# Patient Record
Sex: Male | Born: 1980
Health system: Southern US, Community
[De-identification: ages and names within clinical notes are randomized; demographics above are authoritative.]

## PROBLEM LIST (undated history)

## (undated) DIAGNOSIS — G35 Multiple sclerosis: Secondary | ICD-10-CM

## (undated) DIAGNOSIS — J9819 Other pulmonary collapse: Secondary | ICD-10-CM

## (undated) DIAGNOSIS — Z8619 Personal history of other infectious and parasitic diseases: Secondary | ICD-10-CM

## (undated) DIAGNOSIS — G35D Multiple sclerosis, unspecified: Secondary | ICD-10-CM

## (undated) HISTORY — DX: Other pulmonary collapse: J98.19

## (undated) HISTORY — DX: Personal history of other infectious and parasitic diseases: Z86.19

## (undated) HISTORY — DX: Multiple sclerosis, unspecified: G35.D

## (undated) HISTORY — DX: Multiple sclerosis: G35

---

## 2001-05-19 ENCOUNTER — Encounter: Payer: Self-pay | Admitting: Thoracic Surgery (Cardiothoracic Vascular Surgery)

## 2001-05-19 ENCOUNTER — Inpatient Hospital Stay (HOSPITAL_COMMUNITY): Admission: EM | Admit: 2001-05-19 | Discharge: 2001-05-21 | Payer: Self-pay | Admitting: Emergency Medicine

## 2001-05-20 ENCOUNTER — Encounter: Payer: Self-pay | Admitting: Thoracic Surgery (Cardiothoracic Vascular Surgery)

## 2001-05-21 ENCOUNTER — Encounter: Payer: Self-pay | Admitting: Thoracic Surgery (Cardiothoracic Vascular Surgery)

## 2001-05-28 ENCOUNTER — Encounter: Admission: RE | Admit: 2001-05-28 | Discharge: 2001-05-28 | Payer: Self-pay | Admitting: Unknown Physician Specialty

## 2001-05-28 ENCOUNTER — Encounter: Payer: Self-pay | Admitting: Thoracic Surgery (Cardiothoracic Vascular Surgery)

## 2002-04-19 ENCOUNTER — Encounter: Payer: Self-pay | Admitting: Surgery

## 2002-04-19 ENCOUNTER — Inpatient Hospital Stay (HOSPITAL_COMMUNITY): Admission: EM | Admit: 2002-04-19 | Discharge: 2002-04-24 | Payer: Self-pay | Admitting: Emergency Medicine

## 2002-04-21 ENCOUNTER — Encounter: Payer: Self-pay | Admitting: Surgery

## 2002-04-22 ENCOUNTER — Encounter: Payer: Self-pay | Admitting: Surgery

## 2002-04-23 ENCOUNTER — Encounter: Payer: Self-pay | Admitting: Surgery

## 2002-04-24 ENCOUNTER — Encounter: Payer: Self-pay | Admitting: Surgery

## 2002-04-28 ENCOUNTER — Encounter: Payer: Self-pay | Admitting: Surgery

## 2002-04-28 ENCOUNTER — Encounter: Admission: RE | Admit: 2002-04-28 | Discharge: 2002-04-28 | Payer: Self-pay | Admitting: Surgery

## 2003-03-23 ENCOUNTER — Inpatient Hospital Stay (HOSPITAL_COMMUNITY): Admission: EM | Admit: 2003-03-23 | Discharge: 2003-03-26 | Payer: Self-pay | Admitting: Emergency Medicine

## 2003-04-02 ENCOUNTER — Encounter: Admission: RE | Admit: 2003-04-02 | Discharge: 2003-04-02 | Payer: Self-pay | Admitting: Cardiothoracic Surgery

## 2003-04-15 ENCOUNTER — Inpatient Hospital Stay (HOSPITAL_COMMUNITY): Admission: EM | Admit: 2003-04-15 | Discharge: 2003-04-22 | Payer: Self-pay | Admitting: Emergency Medicine

## 2003-04-26 ENCOUNTER — Inpatient Hospital Stay (HOSPITAL_COMMUNITY): Admission: EM | Admit: 2003-04-26 | Discharge: 2003-05-02 | Payer: Self-pay | Admitting: *Deleted

## 2003-05-07 ENCOUNTER — Encounter: Admission: RE | Admit: 2003-05-07 | Discharge: 2003-05-07 | Payer: Self-pay | Admitting: Cardiothoracic Surgery

## 2003-05-26 ENCOUNTER — Encounter: Admission: RE | Admit: 2003-05-26 | Discharge: 2003-05-26 | Payer: Self-pay | Admitting: Thoracic Surgery

## 2003-07-23 ENCOUNTER — Emergency Department (HOSPITAL_COMMUNITY): Admission: EM | Admit: 2003-07-23 | Discharge: 2003-07-24 | Payer: Self-pay | Admitting: Family Medicine

## 2003-10-25 ENCOUNTER — Emergency Department (HOSPITAL_COMMUNITY): Admission: EM | Admit: 2003-10-25 | Discharge: 2003-10-25 | Payer: Self-pay | Admitting: Family Medicine

## 2003-11-24 ENCOUNTER — Emergency Department (HOSPITAL_COMMUNITY): Admission: EM | Admit: 2003-11-24 | Discharge: 2003-11-24 | Payer: Self-pay | Admitting: Emergency Medicine

## 2003-11-24 ENCOUNTER — Encounter: Admission: RE | Admit: 2003-11-24 | Discharge: 2003-11-24 | Payer: Self-pay | Admitting: Cardiothoracic Surgery

## 2003-12-03 ENCOUNTER — Encounter: Admission: RE | Admit: 2003-12-03 | Discharge: 2003-12-03 | Payer: Self-pay | Admitting: Cardiothoracic Surgery

## 2003-12-09 ENCOUNTER — Inpatient Hospital Stay (HOSPITAL_COMMUNITY): Admission: RE | Admit: 2003-12-09 | Discharge: 2003-12-13 | Payer: Self-pay | Admitting: Cardiothoracic Surgery

## 2004-01-07 ENCOUNTER — Encounter: Admission: RE | Admit: 2004-01-07 | Discharge: 2004-01-07 | Payer: Self-pay | Admitting: Cardiothoracic Surgery

## 2004-03-05 HISTORY — PX: OTHER SURGICAL HISTORY: SHX169

## 2004-04-28 IMAGING — CR DG CHEST 2V
2 series · 2 of 2 positions shown · non-contrast
Comparison: none

CLINICAL DATA: Short of breath. 
 CHEST, TWO VIEWS  
 PA and lateral views of the chest dated April 26, 2003 is compared with a prior study April 22, 2003.  Since the previous study, there is evidence of a right pneumothorax in the apical area estimated to be 15 to 20 percent.  This represents a decided interval change since the previous study.  No change is noted on the left.  There are numerous sutures noted in the apical area. 
 IMPRESSION
 Right apical pneumothorax now estimated to be 15 to 20 percent.

[view not recorded (1 of 2)]
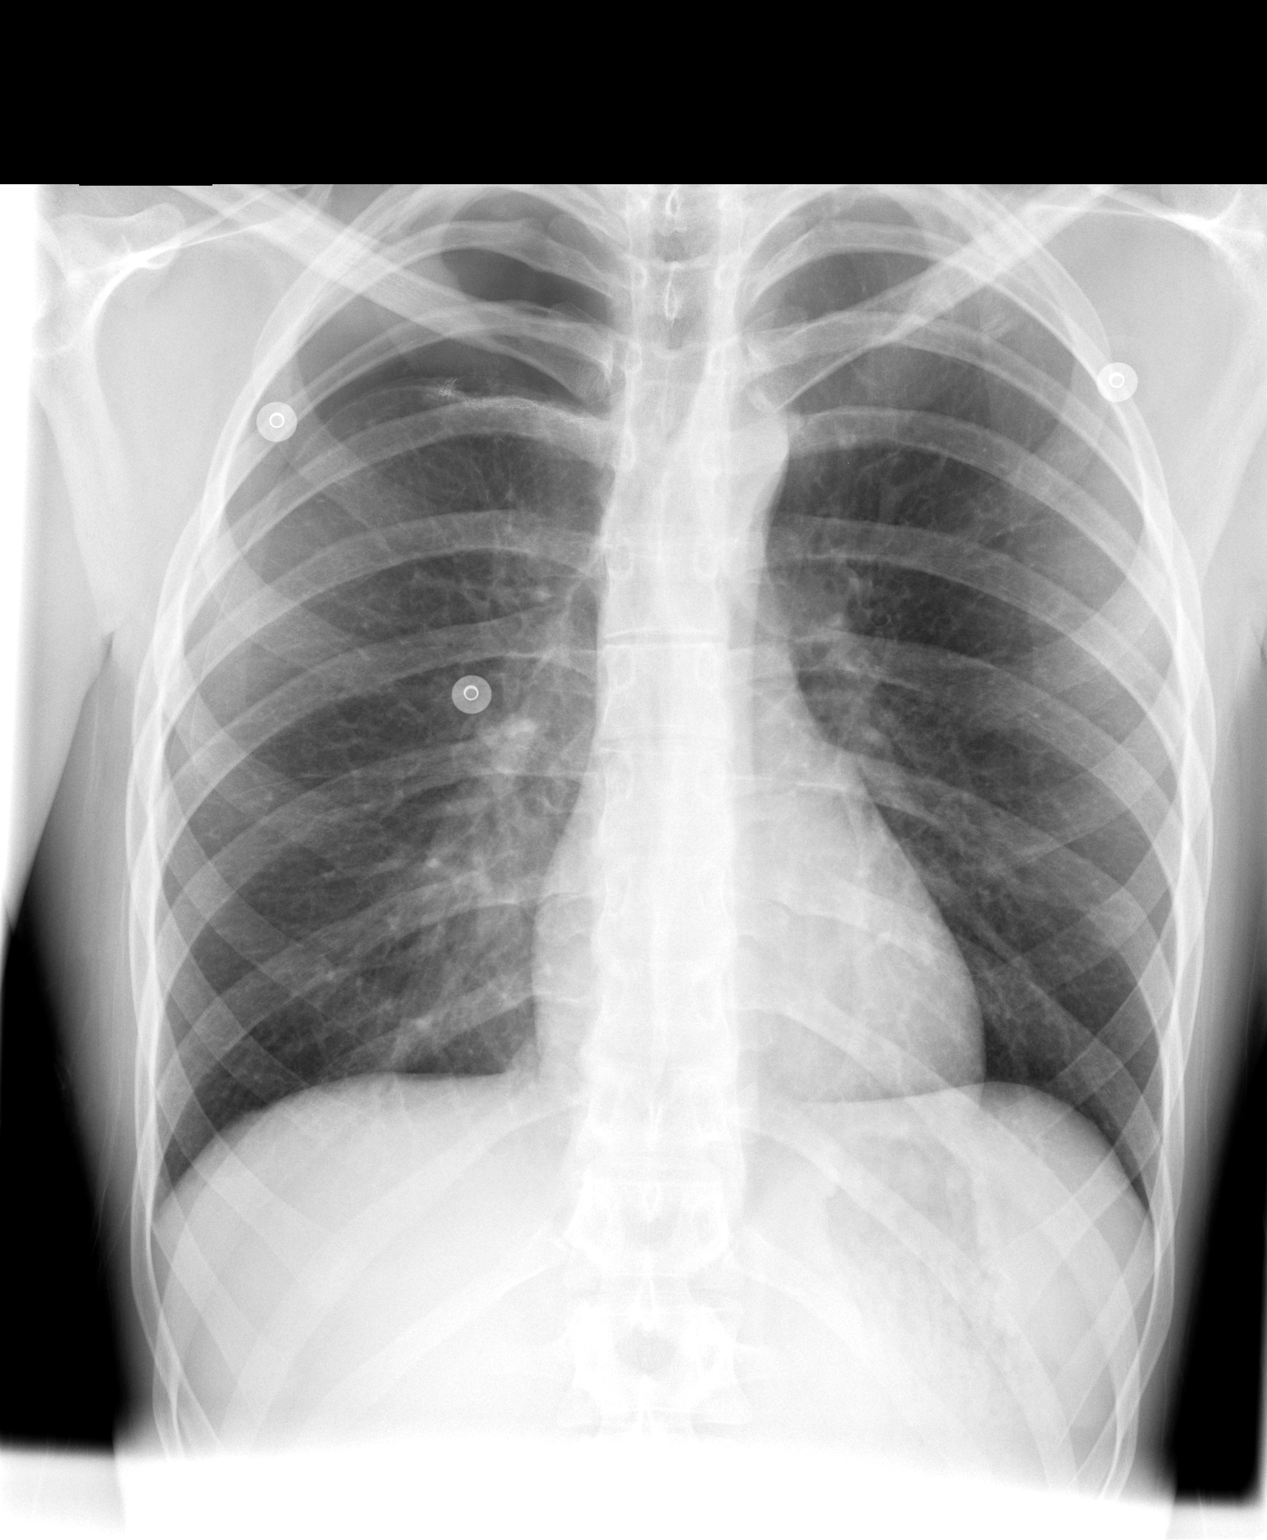

[view not recorded (2 of 2)]
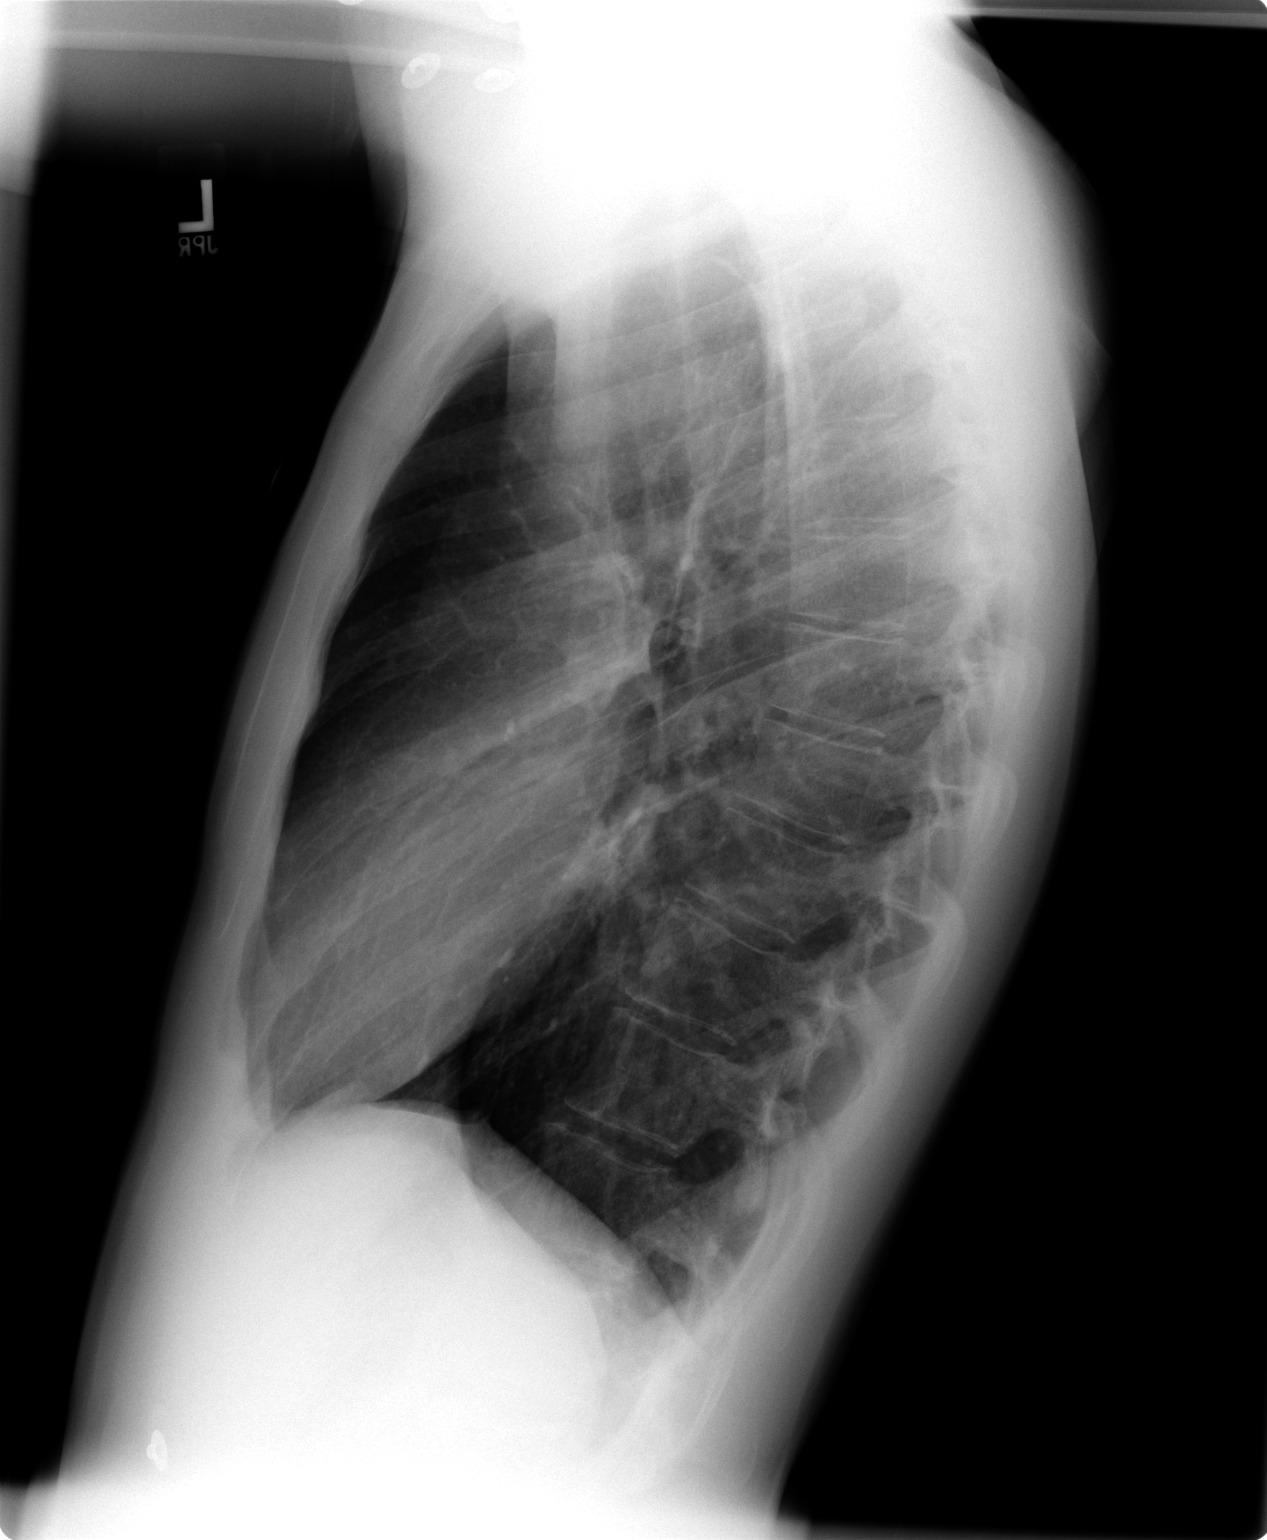

[2 of 2 positions shown; findings below may reference images not displayed]

## 2004-05-02 IMAGING — CR DG CHEST 1V PORT
1 series · 1 of 1 positions shown · non-contrast
Comparison: 04/29/03.

CLINICAL DATA: Lung lesion, pneumothorax.
 CHEST, PORTABLE ONE VIEW ? 04/30/03, 7097

[view not recorded]
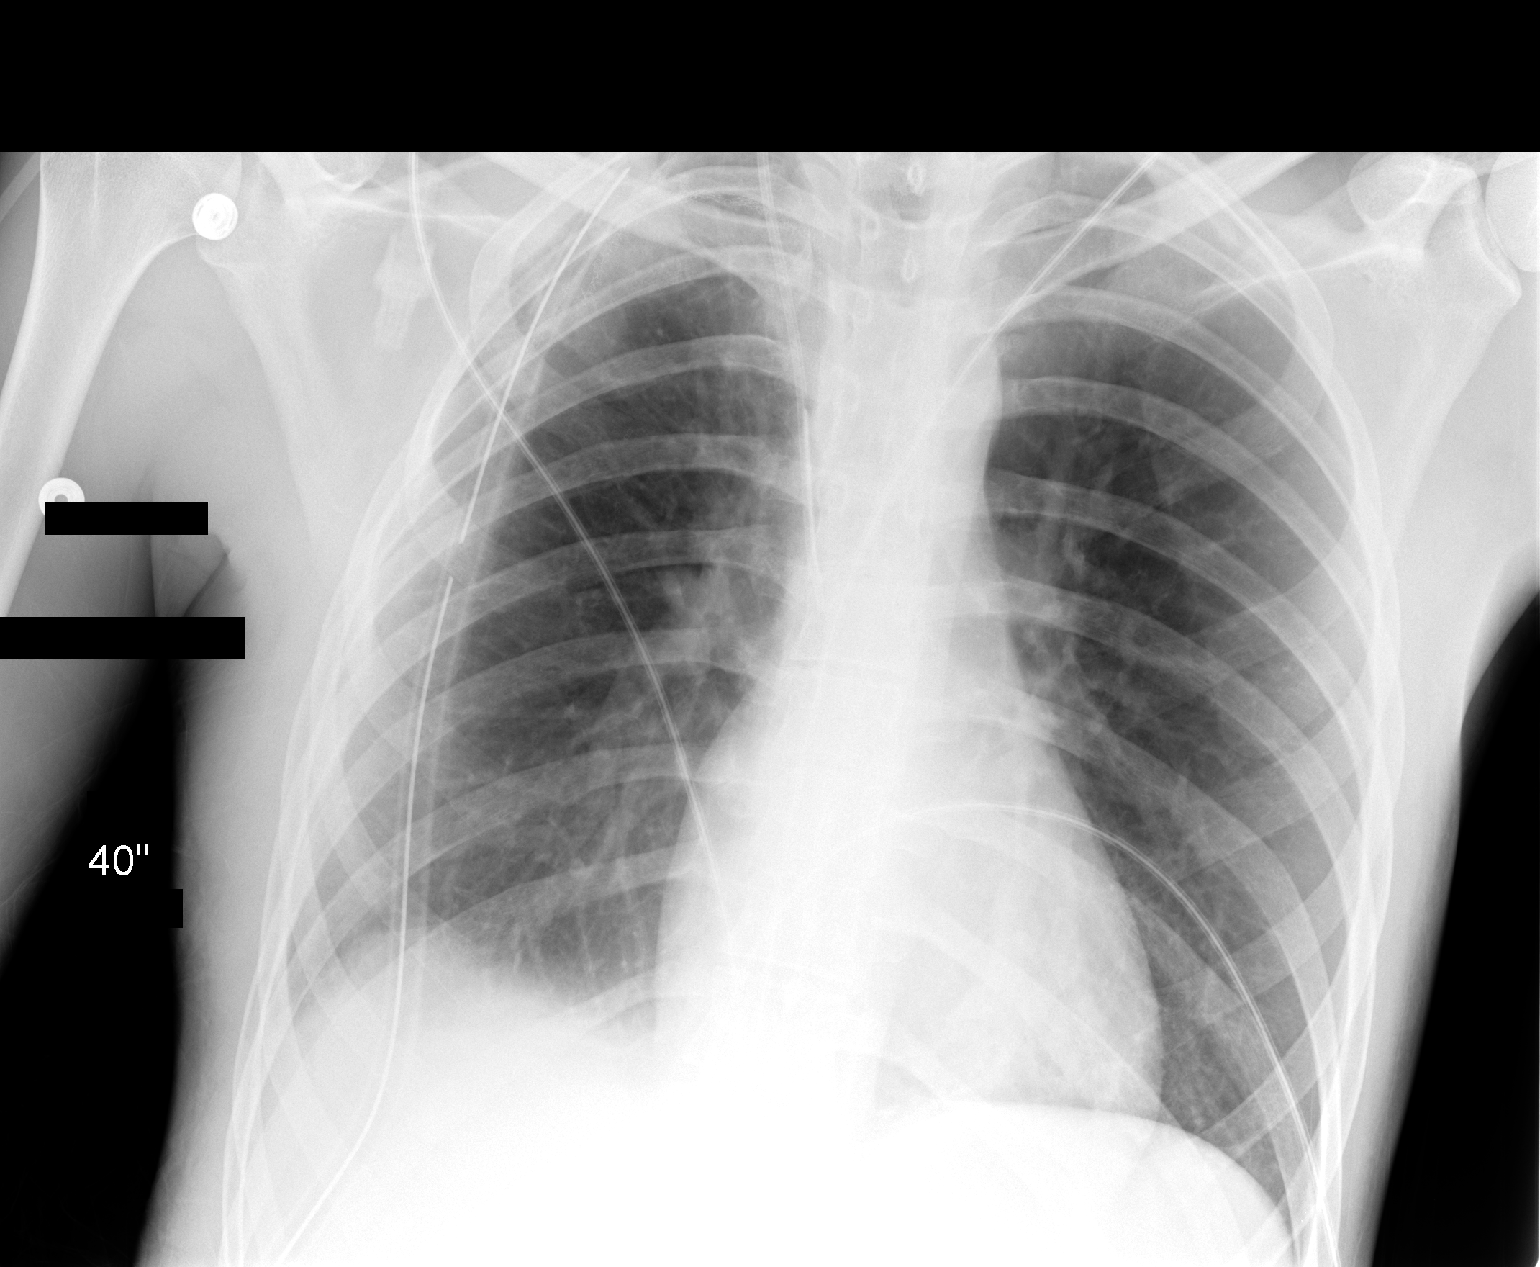

[1 of 1 positions shown; findings below may reference images not displayed]

There is slight interval increase in right lower lobe airspace opacity, atelectasis versus infiltrate.  Left lung clear.  Heart is normal in size. 
 Right chest tube remains in place.  Small right apical pneumothorax noted, not significantly changed since the prior study.
IMPRESSION: Stable small right apical pneumothorax.
 Slight increase in right basilar opacity.

## 2005-01-09 IMAGING — CR DG CHEST 2V
2 series · 2 of 2 positions shown · non-contrast
Comparison: none

CLINICAL DATA: Followup left apical pneumothorax. 
 DIAGNOSTIC CHEST - TWO VIEW:

[view not recorded (1 of 2)]
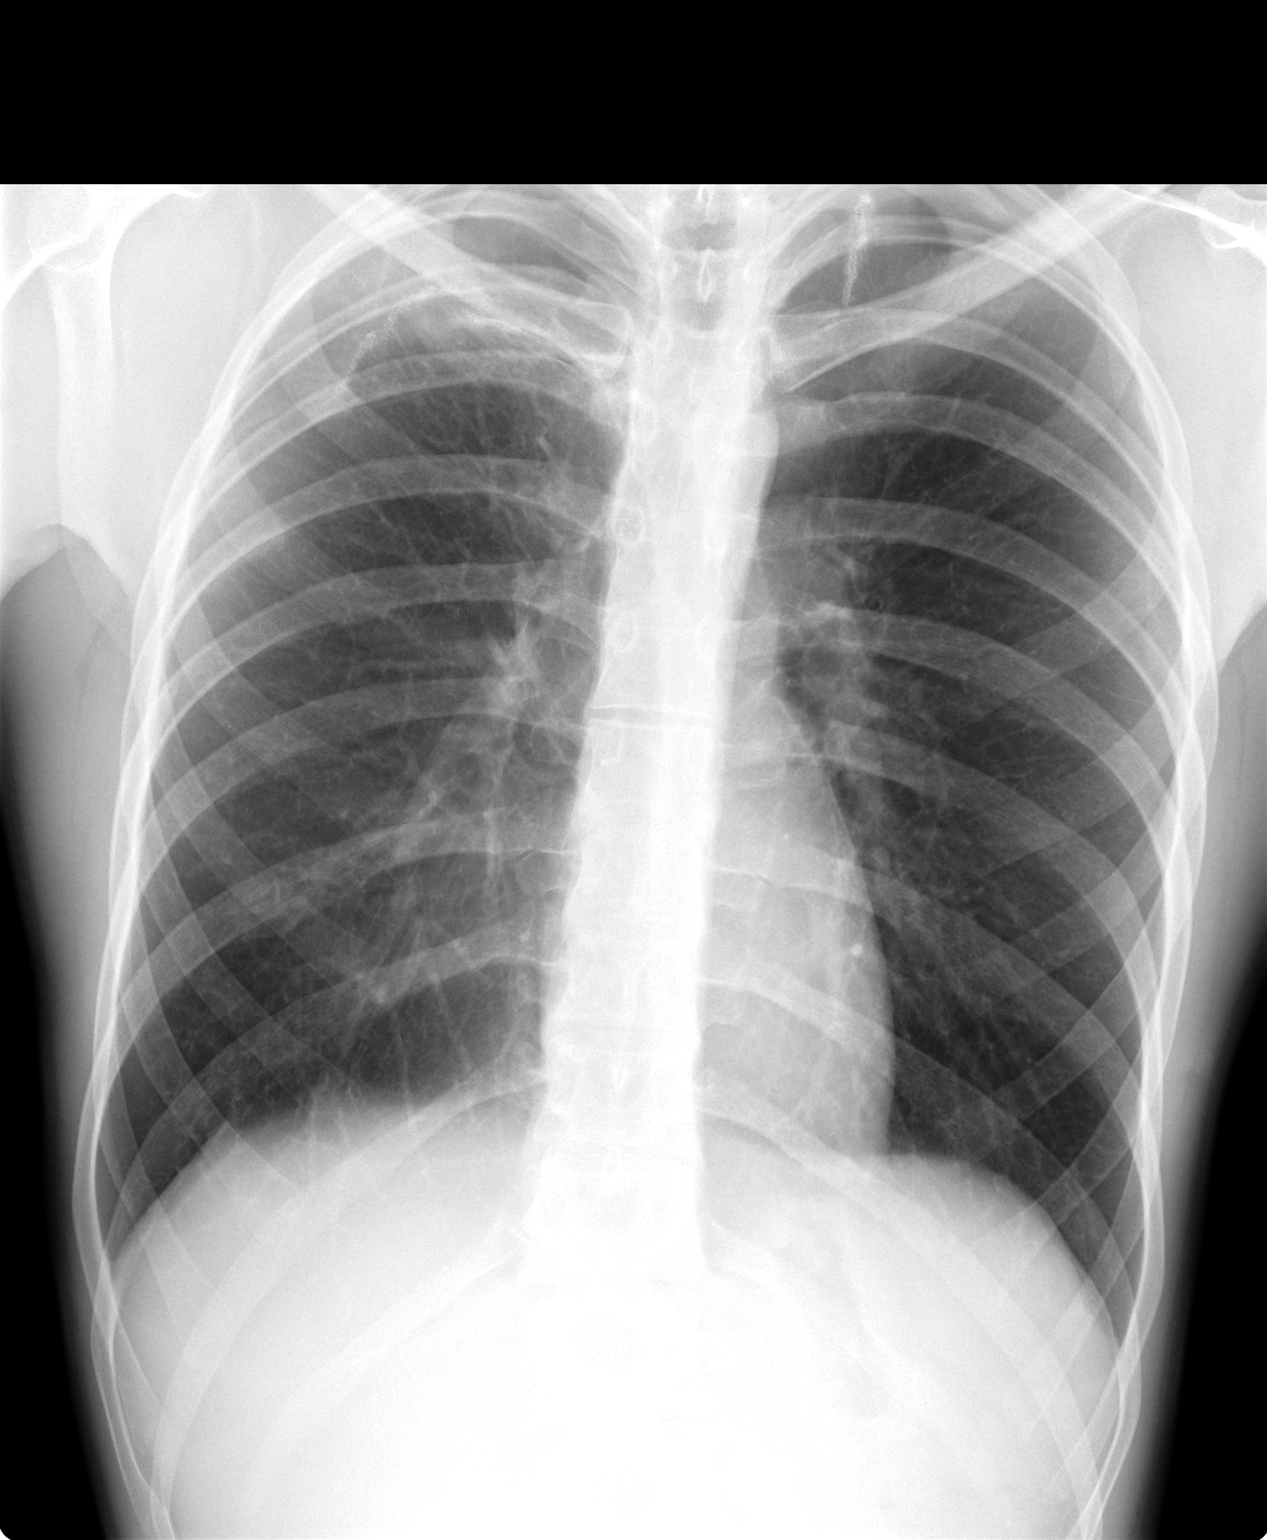

[view not recorded (2 of 2)]
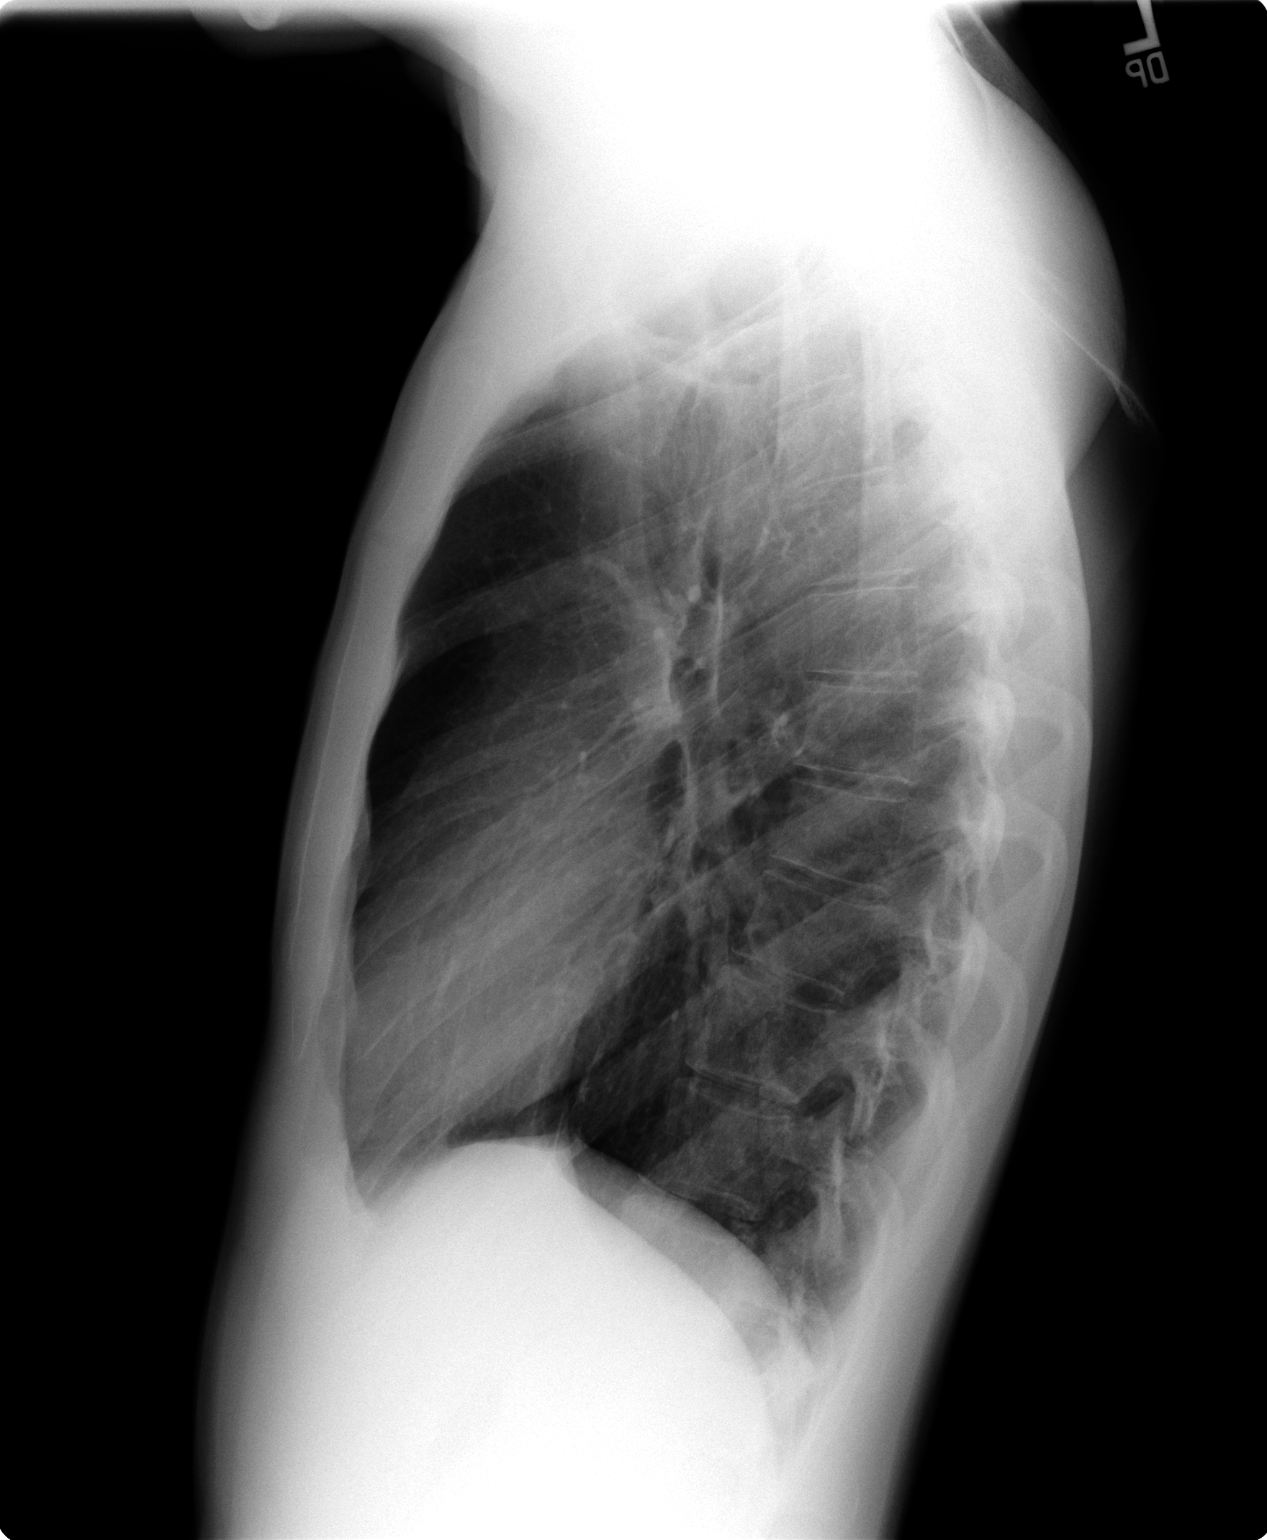

[2 of 2 positions shown; findings below may reference images not displayed]

FINDINGS: Since [REDACTED] chest x-ray 12/13/03 there is resolution of left apical pneumothorax with new left apical sutures seen.  Suture line at the right apex and slight linear fibrosis in this region are stable.  Lungs are clear.  Heart size is normal.  No new abnormality is seen.
IMPRESSION: Since [REDACTED] chest x-ray 12/13/03:
 1.  Post-operative change with resolution previous left apical pneumothorax.
 2.  Stable postoperative change and mild fibrosis right apex.
 3.  Otherwise normal.

## 2010-03-26 ENCOUNTER — Encounter: Payer: Self-pay | Admitting: Thoracic Surgery

## 2010-07-10 ENCOUNTER — Ambulatory Visit (INDEPENDENT_AMBULATORY_CARE_PROVIDER_SITE_OTHER): Payer: 59 | Admitting: Family Medicine

## 2010-07-10 ENCOUNTER — Ambulatory Visit: Payer: Self-pay | Admitting: Family Medicine

## 2010-07-10 ENCOUNTER — Encounter: Payer: Self-pay | Admitting: Family Medicine

## 2010-07-10 DIAGNOSIS — M549 Dorsalgia, unspecified: Secondary | ICD-10-CM | POA: Insufficient documentation

## 2010-07-10 DIAGNOSIS — Z0000 Encounter for general adult medical examination without abnormal findings: Secondary | ICD-10-CM | POA: Insufficient documentation

## 2010-07-10 DIAGNOSIS — Z720 Tobacco use: Secondary | ICD-10-CM | POA: Insufficient documentation

## 2010-07-10 DIAGNOSIS — Z13220 Encounter for screening for lipoid disorders: Secondary | ICD-10-CM | POA: Insufficient documentation

## 2010-07-10 DIAGNOSIS — Z23 Encounter for immunization: Secondary | ICD-10-CM

## 2010-07-10 DIAGNOSIS — Z131 Encounter for screening for diabetes mellitus: Secondary | ICD-10-CM | POA: Insufficient documentation

## 2010-07-10 DIAGNOSIS — F17200 Nicotine dependence, unspecified, uncomplicated: Secondary | ICD-10-CM

## 2010-07-10 LAB — GLUCOSE, RANDOM: Glucose, Bld: 79 mg/dL (ref 70–99)

## 2010-07-10 LAB — LIPID PANEL
HDL: 62.2 mg/dL (ref >39.00)
Total CHOL/HDL Ratio: 2
VLDL: 9.4 mg/dL (ref 0.0–40.0)

## 2010-07-10 MED ORDER — TETANUS-DIPHTH-ACELL PERTUSSIS 5-2.5-18.5 LF-MCG/0.5 IM SUSP: 0.5000 mL | Freq: Once | INTRAMUSCULAR | Status: DC

## 2010-07-10 NOTE — Progress Notes (Signed)
New patient.  CPE- See plan.  Routine anticipatory guidance given to patient.  See health maintenance.  Wants to quit smoking.  2-4 cigs a day.  D/w pt.  Counseled on use of nicotine gum.  He has a quit date.   L lower back pain.  No radiation.  Present several weeks.  No trauma.  No weakness.  No FCNAV.    PMH and SH reviewed  Meds, vitals, and allergies reviewed.   ROS: See HPI.  Otherwise negative.    GEN: nad, alert and oriented HEENT: mucous membranes moist NECK: supple w/o LA CV: rrr. PULM: ctab, no inc wob, old scars noted on chest. ABD: soft, +bs EXT: no edema SKIN: no acute rash BACK: no midline pain.  L paraspinal muscles ttp near the waistline.  SLR neg but hamstrings tight.  Distally nv intact.

## 2010-07-10 NOTE — Assessment & Plan Note (Signed)
Counseled, he has a quit date.  Use the gum and call back as needed.

## 2010-07-10 NOTE — Assessment & Plan Note (Signed)
D/w pt about diet and exercise.  Check HIV, never prev done.  tdap today.  Other screening now up to date.

## 2010-07-10 NOTE — Progress Notes (Signed)
Addended by: Delilah Shan on: 07/10/2010 11:02 AM   Modules accepted: Orders

## 2010-07-10 NOTE — Assessment & Plan Note (Signed)
Check labs 

## 2010-07-10 NOTE — Patient Instructions (Addendum)
I would stretch your back and hamstrings as we discussed.  Stretch slowly and hold it for about 30 seconds.  A heating pad may help you back.  I would check your chair and mouse position at work. I would get flavored nicotine gum and use this as needed as we discussed.  Let me know if you have other concerns.  I would get a flu shot each fall.   Take care.  Glad to see you today.  You can get your results through our phone system.  Follow the instructions on the blue card.

## 2010-07-10 NOTE — Assessment & Plan Note (Signed)
Benign exam, stretch and heat.  He'll check his workstation to see if this increases the strain on lower back.  Fu prn.

## 2010-10-18 ENCOUNTER — Encounter: Payer: Self-pay | Admitting: Family Medicine

## 2010-10-18 ENCOUNTER — Ambulatory Visit (INDEPENDENT_AMBULATORY_CARE_PROVIDER_SITE_OTHER): Payer: 59 | Admitting: Family Medicine

## 2010-10-18 VITALS — BP 104/70 | HR 64 | Temp 97.7°F | Wt 135.8 lb

## 2010-10-18 DIAGNOSIS — S239XXA Sprain of unspecified parts of thorax, initial encounter: Secondary | ICD-10-CM

## 2010-10-18 DIAGNOSIS — S29012A Strain of muscle and tendon of back wall of thorax, initial encounter: Secondary | ICD-10-CM | POA: Insufficient documentation

## 2010-10-18 MED ORDER — CYCLOBENZAPRINE HCL 5 MG PO TABS: 5.0000 mg | ORAL_TABLET | Freq: Two times a day (BID) | ORAL | Status: AC | PRN

## 2010-10-18 NOTE — Patient Instructions (Signed)
Muscle strain in back. Start flexeril as muscle relaxant - may make you sleepy. Continue ibuprofen. Stretching exercises for back provided as well. If not improving, let us know for referral to physical therapy.   Call us if any questions.

## 2010-10-18 NOTE — Progress Notes (Signed)
  Subjective:    Patient ID: Juan Foster, male    DOB: [birth year], 30 y.o.   MRN: [id number]  HPI CC: check place on back  Started noticing stiffness 7d ago right shoulder, then 5d ago started feeling stabbing pain inside shoulder blade.  Had played golf, and thought may have pulled muscle.  Worsening pain, feels one specific spot where has sharp pain when pushes on this area.  Wanted checked out.  Did try ice when this started.  No SOB, cough, pleurisy, no fevers/chills, no changes on skin.  Has cut out cigarettes, rarely smokes pipe  never had anything like this before  Review of Systems Per HPI    Objective:   Physical Exam  Nursing note and vitals reviewed. Constitutional: He appears well-developed and well-nourished. No distress.  Musculoskeletal: He exhibits no edema.       Mild midline thoracic tenderness. Main tenderness located right rhomboid major with spasm/tightness noted. Scapular dyskinesia on right compared to left. Reproducible tenderness to palpation. O/w FROM at shoulders  Skin: Skin is warm and dry. No rash noted. No erythema.  Psychiatric: He has a normal mood and affect.          Assessment & Plan:

## 2010-10-18 NOTE — Assessment & Plan Note (Signed)
Anticipate rhomboid strain. Supportive care, continue NSAIDs and start flexeril. SM pt handout on upper back pain provided. If not better discussed possible referral to PT.

## 2011-04-17 ENCOUNTER — Ambulatory Visit (INDEPENDENT_AMBULATORY_CARE_PROVIDER_SITE_OTHER): Payer: 59 | Admitting: Family Medicine

## 2011-04-17 ENCOUNTER — Encounter: Payer: Self-pay | Admitting: Family Medicine

## 2011-04-17 VITALS — BP 122/84 | HR 54 | Temp 98.3°F | Wt 138.0 lb

## 2011-04-17 DIAGNOSIS — M25519 Pain in unspecified shoulder: Secondary | ICD-10-CM

## 2011-04-17 NOTE — Progress Notes (Signed)
Upper back and shoulder pain started during golf month ago.  Lump on R upper back, tender, enlarging per patient.  Pain present since ~8/12, was seen by Dr. Reece Foster. Hasn't been through PT.  Has consistent shoulder/upper back pain. No deficit in rom.    R leg pain is resolved as of today, so this was tabled.   Meds, vitals, and allergies reviewed.   ROS: See HPI.  Otherwise, noncontributory.  nad ncat rrr ctab Old scarring on chest noted Small, likely unrelated skin cyst on back, <1cm. Not irritated  Pain with ext rotation, impingement, and supraspinatus testing on R, +scap assist with ext rotation and supraspinatus testing.   Scapular dysfunction on ROM testing

## 2011-04-17 NOTE — Patient Instructions (Signed)
Shirlee Limerick will call you about the referral.  Use the home exercises.  Take care.

## 2011-04-18 DIAGNOSIS — M25519 Pain in unspecified shoulder: Secondary | ICD-10-CM | POA: Insufficient documentation

## 2011-04-18 NOTE — Assessment & Plan Note (Addendum)
Likely due to scapular dysfunction.  Refer to PT.  Anatomy and home exercises d/w pt.  He understood.

## 2011-05-04 ENCOUNTER — Encounter: Payer: Self-pay | Admitting: *Deleted

## 2011-05-04 ENCOUNTER — Telehealth: Payer: Self-pay | Admitting: Family Medicine

## 2011-05-04 NOTE — Telephone Encounter (Signed)
Have been calling this patient for 2 weeks to schedule the PT that was ordered on 04/17/2011. Now after leaving several messages on the only number we had the phone has been disconnected. Do you want to cancel this PT order? Pls advise, no response from the patient.

## 2011-05-04 NOTE — Telephone Encounter (Signed)
Letter mailed

## 2011-05-04 NOTE — Telephone Encounter (Signed)
Please send a letter asking him to contact the office.  Leave the order open.  Thanks.

## 2011-07-19 ENCOUNTER — Encounter: Payer: Self-pay | Admitting: Family Medicine

## 2011-07-19 ENCOUNTER — Ambulatory Visit (INDEPENDENT_AMBULATORY_CARE_PROVIDER_SITE_OTHER): Payer: 59 | Admitting: Family Medicine

## 2011-07-19 VITALS — BP 120/72 | HR 78 | Temp 98.3°F | Ht 72.0 in | Wt 137.8 lb

## 2011-07-19 DIAGNOSIS — R209 Unspecified disturbances of skin sensation: Secondary | ICD-10-CM

## 2011-07-19 DIAGNOSIS — M5412 Radiculopathy, cervical region: Secondary | ICD-10-CM

## 2011-07-19 DIAGNOSIS — R202 Paresthesia of skin: Secondary | ICD-10-CM

## 2011-07-19 MED ORDER — PREDNISONE 20 MG PO TABS: ORAL_TABLET | ORAL | Status: DC

## 2011-07-19 MED ORDER — AMITRIPTYLINE HCL 50 MG PO TABS: 50.0000 mg | ORAL_TABLET | Freq: Every day | ORAL | Status: DC

## 2011-07-19 NOTE — Progress Notes (Signed)
Patient Name: Juan Foster Date of Birth: 25-Jan-1981 Age: 31 y.o. Medical Record Number: [id number] Gender: male Date of Encounter: 07/19/2011  History of Present Illness:  Juan Foster is a 31 y.o. very pleasant male patient who presents with the following:  Last Tuesday, was itching and really itching bad. Burns like it is on fire. About the end of the week, and numbness and itching, but the itching will stop just below the elbow. The tingling and paresthesias continue farther  Along the arm in a median nerve distribution. There is currently no rash. No trauma, accident or injury.  Did some home rehab with shoulder. The last 6 months, patient has had some intermittent right-sided posterior shoulder blade pain. Now he's also having some neck pain and some pain going down his arm with tingling, itching, and pain in a median nerve distribution at least distally.  History is significant for multiple pneumothoraxes, and multiple chest tubes, and opened thoracic surgery, and subsequently some altered neurological sensations after this.  Patient Active Problem List  Diagnoses  . Tobacco abuse  . Routine general medical examination at a health care facility  . Screening for lipoid disorders  . Screening for diabetes mellitus (DM)  . Shoulder pain   Past Medical History  Diagnosis Date  . 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 History of chicken pox    Past Surgical History  Procedure Date  . Surgery to fuse lungs to cavity wall to prevent collapse 2006   History  Substance Use Topics  . Smoking status: Former Games developer  . Smokeless tobacco: Not on file  . Alcohol Use: Yes     1 bottle of wine/week   Family History  Problem Relation Age of Onset  . Sudden death Other   . COPD Paternal Grandfather   . Prostate cancer Neg Hx   . Colon cancer Neg Hx    No Known Allergies Current Outpatient Prescriptions on File Prior to Visit  Medication Sig  Dispense Refill  . amitriptyline (ELAVIL) 50 MG tablet Take 1 tablet (50 mg total) by mouth at bedtime.  30 tablet  3     Past Medical History, Surgical History, Social History, Family History, Problem List, Medications, and Allergies have been reviewed and updated if relevant.  Review of Systems:  GEN: No fevers, chills. Nontoxic. Primarily MSK c/o today. MSK: Detailed in the HPI GI: tolerating PO intake without difficulty Neuro: detailed above Otherwise the pertinent positives of the ROS are noted above.    Physical Examination: Filed Vitals:   07/19/11 1535  BP: 120/72  Pulse: 78  Temp: 98.3 F (36.8 C)  TempSrc: Oral  Height: 6' (1.829 m)  Weight: 137 lb 12 oz (62.483 kg)  SpO2: 98%    Body mass index is 18.68 kg/(m^2).   GEN: Well-developed,well-nourished,in no acute distress; alert,appropriate and cooperative throughout examination HEENT: Normocephalic and atraumatic without obvious abnormalities. Ears, externally no deformities PULM: Breathing comfortably in no respiratory distress EXT: No clubbing, cyanosis, or edema PSYCH: Normally interactive. Cooperative during the interview. Pleasant. Friendly and conversant. Not anxious or depressed appearing. Normal, full affect.  CERVICAL SPINE EXAM Range of motion: Flexion, extension, lateral bending, and rotation: preserved Pain with terminal motion: yes, in all directions Spinous Processes: NT SCM: NT Upper paracervical muscles: mildly tender Upper traps: mildly tender C5-T1 intact, sensation and motor: motor function is preserved. There is decreased sensation to gross touch  as well as pinprick in a median nerve distribution.  Deep tendon reflexes are 2+ bilaterally.  Full range of motion at the shoulder. Strength testing is 5/5. Nontender a.c. Joint. Nontender in the bicipital groove. Neer test is negative. Leanord Asal test is negative. Negative sulcus.   Assessment and Plan:  1. Cervical radiculopathy  DG  Cervical Spine Complete, predniSONE (DELTASONE) 20 MG tablet, amitriptyline (ELAVIL) 50 MG tablet  2. Tingling in extremities     Suspicious that this is cervical radiculopathy and suspect that the posterior shoulder blade pain the patient may have been experiencing for many months may be referred from the neck. He certainly has neck pain today.  Cannot exclude an atypical early shingles.  For now, we will image his neck, initiates neuropathic pain medications and give him a burst of prednisone. Recheck in one month.  Orders Today: Orders Placed This Encounter  Procedures  . DG Cervical Spine Complete    Standing Status: Future     Number of Occurrences:      Standing Expiration Date: 09/17/2012    Order Specific Question:  Preferred imaging location?    Answer:  [facility]    Order Specific Question:  Reason for exam:    Answer:  neck pain, radiculopathy    Medications Today: Meds ordered this encounter  Medications  . predniSONE (DELTASONE) 20 MG tablet    Sig: 2 po daily for 5 days, then 1 po daily for 5 days    Dispense:  15 tablet    Refill:  0  . amitriptyline (ELAVIL) 50 MG tablet    Sig: Take 1 tablet (50 mg total) by mouth at bedtime.    Dispense:  30 tablet    Refill:  3

## 2011-07-27 ENCOUNTER — Ambulatory Visit (INDEPENDENT_AMBULATORY_CARE_PROVIDER_SITE_OTHER)
Admission: RE | Admit: 2011-07-27 | Discharge: 2011-07-27 | Disposition: A | Discharge status: Home / Self Care | Payer: 59 | Source: Ambulatory Visit | Attending: Family Medicine | Admitting: Family Medicine

## 2011-07-27 DIAGNOSIS — M5412 Radiculopathy, cervical region: Secondary | ICD-10-CM

## 2011-07-31 ENCOUNTER — Encounter: Payer: Self-pay | Admitting: *Deleted

## 2011-08-13 ENCOUNTER — Ambulatory Visit (INDEPENDENT_AMBULATORY_CARE_PROVIDER_SITE_OTHER): Payer: 59 | Admitting: Family Medicine

## 2011-08-13 ENCOUNTER — Encounter: Payer: Self-pay | Admitting: Family Medicine

## 2011-08-13 VITALS — BP 112/64 | HR 76 | Temp 98.1°F | Wt 137.0 lb

## 2011-08-13 DIAGNOSIS — G5610 Other lesions of median nerve, unspecified upper limb: Secondary | ICD-10-CM

## 2011-08-13 DIAGNOSIS — R202 Paresthesia of skin: Secondary | ICD-10-CM

## 2011-08-13 DIAGNOSIS — R209 Unspecified disturbances of skin sensation: Secondary | ICD-10-CM

## 2011-08-13 NOTE — Patient Instructions (Signed)
Cut back to 1/2 tab on the amitriptyline if needed.  See Shirlee Limerick about your referral before you leave today. Take care.

## 2011-08-13 NOTE — Assessment & Plan Note (Signed)
Unclear if atypical shingles, peripheral or radicular compression.  Would cut back to 1/2 dose of TCA if tolerated, whole dose o/w.  Would refer to neuro for consideration of nerve conduction studies.  D/w pt.  He agrees.

## 2011-08-13 NOTE — Progress Notes (Signed)
He had some burning and itching along median nerve distribution.  Saw Dr. Patsy Lager and started on pred taper and TCA.  Dec in sensation on R 1-2 digits and median nerve area- persisting.  The itching is much better.  Still with itching if the medial area of R forearm is touched, but it doesn't wake him up as prev.  He's taking TCA at night and it makes him drowsy the next day and likely couldn't tolerate higher dose.    Shoulder pain is resolved after home exercise program for scapula, cuff.    Meds, vitals, and allergies reviewed.   ROS: See HPI.  Otherwise, noncontributory.  nad ncat Neck supple, no LA Normal ROM in the shoulders R arm and hand with R median nerve distribution dec in sensation but no rash.  CN 2-12 wnl B, S/S/DTR wnl x4 o/w.

## 2011-08-14 ENCOUNTER — Encounter: Payer: Self-pay | Admitting: Neurology

## 2011-08-14 ENCOUNTER — Ambulatory Visit (INDEPENDENT_AMBULATORY_CARE_PROVIDER_SITE_OTHER): Payer: 59 | Admitting: Neurology

## 2011-08-14 VITALS — BP 118/76 | HR 72 | Wt 136.0 lb

## 2011-08-14 DIAGNOSIS — R209 Unspecified disturbances of skin sensation: Secondary | ICD-10-CM

## 2011-08-14 DIAGNOSIS — R200 Anesthesia of skin: Secondary | ICD-10-CM

## 2011-08-14 NOTE — Patient Instructions (Signed)
We will call with your appointment for the nerve conduction studies/electromyelogram.  They will be done at [facility] [address] [place], [place] [phone].

## 2011-08-14 NOTE — Progress Notes (Signed)
Dear Dr. Para March,  Thank you for having me see Juan Foster in consultation today at [facility] for his problem with right handnumbness.  As you may recall, he is a 31 y.o. year old male with a history of spontaneous pneumothoraces s/p pleurodesis who over the last month has developed numbness of his right thumb and index finger.  This was after what he describes as itching pain in his right antecubital fossa.  He had neck pain about two months before that resolved.  He denies pain at this time, and has no tingling in the hand.  It is not worse at night.  He has never had this before.  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  Past Medical History  Diagnosis Date  . 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 History of chicken pox     Past Surgical History  Procedure Date  . Surgery to fuse lungs to cavity wall to prevent collapse 2006    History   Social History  . Marital Status: Single    Spouse Name: N/A    Number of Children: N/A  . Years of Education: N/A   Occupational History  . Risk analyst    Social History Main Topics  . Smoking status: Former Smoker    Quit date: 02/13/2011  . Smokeless tobacco: Never Used  . Alcohol Use: Yes     1 bottle of wine/week  . Drug Use: No  . Sexually Active: None   Other Topics Concern  . None   Social History Narrative   Exercises at least 3 times a week.Education:  [employer], [employer] as of 5/2012[employer], graphic designEnjoys hiking, photography    Family History  Problem Relation Age of Onset  . Sudden death Other   . COPD Paternal Grandfather   . Prostate cancer Neg Hx   . Colon cancer Neg Hx     Current Outpatient Prescriptions on File Prior to Visit  Medication Sig Dispense Refill  . amitriptyline (ELAVIL) 50 MG tablet Take 1 tablet (50 mg total) by mouth at bedtime.  30 tablet  3    No Known Allergies    ROS:  13 systems were reviewed  and are unremarkable.   Examination:  Filed Vitals:   08/14/11 1517  BP: 118/76  Pulse: 72  Weight: 136 lb (61.689 kg)     In general, thin young man.    Mental status:   The patient is oriented to person, place and time. Recent and remote memory are intact. Attention span and concentration are normal. Language including repetition, naming, following commands are intact. Fund of knowledge of current and historical events, as well as vocabulary are normal.  Cranial Nerves: Pupils are equally round and reactive to light. Visual fields full to confrontation. Extraocular movements are intact without nystagmus. Facial sensation and muscles of mastication are intact. Muscles of facial expression are symmetric. Hearing intact to bilateral finger rub. Tongue protrusion, uvula, palate midline.  Shoulder shrug intact  Motor:  The patient has normal bulk and tone, no pronator drift.  There are no adventitious movements.  Muscles of the hand are normal strength.  5/5 muscle strength bilaterally.  Reflexes:   Biceps  Triceps Brachioradialis Knee Ankle  Right 2+  2+  2+   2+ 2+  Left  2+  2+  2+   2+ 2+  Toes  down  Coordination:  Normal finger to nose.  No dysdiadokinesia.  Sensation is decreased to pin in the right half of the index finger and thumb, spares palm.  Gait and Station are normal.  Tandem gait is intact.  Romberg is negative.  Negative spurlings, phalens.   Impression/Recs: Likely right CTS vs. radiculopathy - although the history of the antecubital fossa itching and pain is unusual preceding this.  EMG/NCS of RUE.  Splints for now.  Will get MRI c-spine if radiculopathy on EMG.   We will see the patient back in 2 months.  Thank you for having [place] see Juan Foster in consultation.  Feel free to contact me with any questions.  Lupita Raider Modesto Charon, MD [facility], [place] [address] [place], [place] [address] Phone: [phone] Fax: [phone].

## 2011-09-03 ENCOUNTER — Telehealth: Payer: Self-pay

## 2011-09-03 ENCOUNTER — Other Ambulatory Visit: Payer: Self-pay

## 2011-09-03 DIAGNOSIS — M5412 Radiculopathy, cervical region: Secondary | ICD-10-CM

## 2011-09-03 NOTE — Progress Notes (Signed)
Normal NCS/EMG except for increased insertional activity of the EDC.  However, a C-spine MRI was recommended by Dr. Anne Hahn.  Will proceed with study.

## 2011-09-03 NOTE — Telephone Encounter (Signed)
Pt informed of normal NCS/EMG and of appt for MRI C spine on July 18 at 8:00am  [facility]

## 2011-09-11 ENCOUNTER — Other Ambulatory Visit (HOSPITAL_COMMUNITY): Payer: 59

## 2011-09-17 ENCOUNTER — Telehealth: Payer: Self-pay | Admitting: Neurology

## 2011-09-17 NOTE — Telephone Encounter (Signed)
Spoke with the patient. States symptoms have resolved. Wearing splints at HS. Has decided not to have the MRI c-spine at this time. Will pursue if symptoms return.

## 2011-09-17 NOTE — Telephone Encounter (Signed)
Pt is no longer experiencing numbness in his fingers. Does he still need to go to his MRI on Thursday 09/20/2011?

## 2011-09-19 ENCOUNTER — Encounter: Payer: Self-pay | Admitting: Neurology

## 2011-09-20 ENCOUNTER — Ambulatory Visit (HOSPITAL_COMMUNITY): Admission: RE | Admit: 2011-09-20 | Payer: 59 | Source: Ambulatory Visit

## 2011-10-18 ENCOUNTER — Encounter: Payer: Self-pay | Admitting: Neurology

## 2011-10-18 ENCOUNTER — Ambulatory Visit (INDEPENDENT_AMBULATORY_CARE_PROVIDER_SITE_OTHER): Payer: 59 | Admitting: Neurology

## 2011-10-18 VITALS — BP 110/70 | HR 60 | Wt 136.0 lb

## 2011-10-18 DIAGNOSIS — G5610 Other lesions of median nerve, unspecified upper limb: Secondary | ICD-10-CM

## 2011-10-19 NOTE — Progress Notes (Signed)
Dear Dr. Para March,  I saw  Juan Foster back in [place] [facility] for his problem with right arm numbness.  At his last visit I felt it was likely either carpal tunnel syndrome or a radiculopathy.  EMG/NCS however was unremarkable, except for some increase insertional activity in the right EDC which could be a sign of a C6 radic.  However, in the meantime, he has had complete remission of his pain.  He was using a CT splint at night that may have helped.  He is on Elavil 25mg  qhs but can't tolerate more and would like to stop this.  He did not get his MRI C-spine.  Medical history, social history, and family history were reviewed and have not changed since the last clinic visit.  Current Outpatient Prescriptions on File Prior to Visit  Medication Sig Dispense Refill  . amitriptyline (ELAVIL) 50 MG tablet Take 50 mg by mouth at bedtime. Taking 1/2        No Known Allergies  ROS:  13 systems were reviewed and are unremarkable.  Exam: . Filed Vitals:   10/18/11 1507  BP: 110/70  Pulse: 60  Weight: 136 lb (61.689 kg)    In general, well appearing man.    Motor:  Normal bulk and tone, no drift and 5/5 muscle strength bilaterally including intrinsics of hands  Reflexes:  1+ thoughout UE  Impression/Recommendations:  1.  Right arm pain now resolved - likely CT syndrome vs.C6  radiculopathy.  I have told him he can stop the Elavil 25mg  qhs.  If the pain recurs he probably should get an MRI of his C-spine.  He will follow up with yourself.  Lupita Raider Modesto Charon, MD [facility], [place]

## 2012-04-08 ENCOUNTER — Encounter: Payer: Self-pay | Admitting: Family Medicine

## 2012-04-08 ENCOUNTER — Telehealth: Payer: Self-pay

## 2012-04-08 ENCOUNTER — Ambulatory Visit (INDEPENDENT_AMBULATORY_CARE_PROVIDER_SITE_OTHER)
Admission: RE | Admit: 2012-04-08 | Discharge: 2012-04-08 | Disposition: A | Discharge status: Home / Self Care | Payer: 59 | Source: Ambulatory Visit | Attending: Family Medicine | Admitting: Family Medicine

## 2012-04-08 ENCOUNTER — Ambulatory Visit: Payer: Self-pay | Admitting: Family Medicine

## 2012-04-08 ENCOUNTER — Ambulatory Visit (INDEPENDENT_AMBULATORY_CARE_PROVIDER_SITE_OTHER): Payer: 59 | Admitting: Family Medicine

## 2012-04-08 VITALS — BP 102/64 | HR 68 | Temp 98.3°F | Wt 136.0 lb

## 2012-04-08 DIAGNOSIS — R1031 Right lower quadrant pain: Secondary | ICD-10-CM | POA: Insufficient documentation

## 2012-04-08 DIAGNOSIS — R202 Paresthesia of skin: Secondary | ICD-10-CM

## 2012-04-08 DIAGNOSIS — R200 Anesthesia of skin: Secondary | ICD-10-CM | POA: Insufficient documentation

## 2012-04-08 DIAGNOSIS — M79609 Pain in unspecified limb: Secondary | ICD-10-CM

## 2012-04-08 DIAGNOSIS — M79604 Pain in right leg: Secondary | ICD-10-CM | POA: Insufficient documentation

## 2012-04-08 DIAGNOSIS — M545 Low back pain: Secondary | ICD-10-CM | POA: Insufficient documentation

## 2012-04-08 DIAGNOSIS — R209 Unspecified disturbances of skin sensation: Secondary | ICD-10-CM

## 2012-04-08 MED ORDER — DICLOFENAC SODIUM 75 MG PO TBEC: 75.0000 mg | DELAYED_RELEASE_TABLET | Freq: Two times a day (BID) | ORAL | Status: DC

## 2012-04-08 NOTE — Assessment & Plan Note (Signed)
Exam underwhelming for as much pain as pt states he is experiencing. ? Radiculopathy in L2 vs other pain syndrome.  Will treat with NSAID and follow up with PCP if not improving.

## 2012-04-08 NOTE — Assessment & Plan Note (Addendum)
?   Compression neuropathy although not in distinct distribution. L1 all the way to S1. Most likely due to lumbar spine source or some fibromyalgia variant given recent shoulder pain issues without clear cause.

## 2012-04-08 NOTE — Assessment & Plan Note (Signed)
Minimal swelling but with severe unitlateral leg without clear cause...will rule out clot in right leg.

## 2012-04-08 NOTE — Progress Notes (Signed)
  Subjective:    Patient ID: Juan Foster, male    DOB: Oct 15, 1980, 32 y.o.   MRN: [id number]  HPI  32 year old male previously healthy presents with new onset 3 days ago pain in right leg. Patient seen as walk in-- asking to be seen because he is scared to wait for appt to be seen later in the day by PCP.  He woke up with numbness, tingling in right leg from hip to foot, entire leg. Progressively worsening... severe pain in right groin. He feels it is getting worse over the day.  Calf is very tender. He is usually is cold but his leg feels warm.  No redness or swelling.  No low back pain. No recent falls, injuries. Has not been working out lately.  Has been moving normally.    No past back problems, no past leg issues. It appears he has had similar  ? Nerve issues in shoulder in last year. Was on amitryptiline. Saw Dr. Modesto Charon for  nerve conduction: nml, X-ray cervical spine nml. Never had MRI. Pain in shoulders resovled completely after rest over honeymoon.       Review of Systems  Constitutional: Negative for fever and fatigue.  HENT: Negative for ear pain.   Eyes: Negative for pain.  Respiratory: Negative for shortness of breath.   Cardiovascular: Negative for chest pain.  Gastrointestinal: Negative for abdominal pain.       Objective:   Physical Exam  Constitutional: Vital signs are normal. He appears well-developed and well-nourished.  HENT:  Head: Normocephalic.  Right Ear: Hearing normal.  Left Ear: Hearing normal.  Nose: Nose normal.  Mouth/Throat: Oropharynx is clear and moist and mucous membranes are normal.  Neck: Trachea normal. Carotid bruit is not present. No mass and no thyromegaly present.  Cardiovascular: Normal rate, regular rhythm and normal pulses.  Exam reveals no gallop, no distant heart sounds and no friction rub.   No murmur heard.      Slight swelling in right leg  Pulmonary/Chest: Effort normal and breath sounds normal. No respiratory  distress.  Musculoskeletal:       Lumbar back: He exhibits tenderness. He exhibits normal range of motion, no bony tenderness, no swelling, no laceration, no pain and no spasm.       Pain  To palpation in right groin severe per pt  Pain with squeezing calf.  TTP over diffuse right lumbar spine and right buttock. Neg SLR. Neg Faber's  Neurological: He has normal strength. A sensory deficit is present. No cranial nerve deficit. He exhibits normal muscle tone.       Nml gait, non antalgic  Skin: Skin is warm, dry and intact. No rash noted.  Psychiatric: He has a normal mood and affect. His speech is normal and behavior is normal. Thought content normal.          Assessment & Plan:

## 2012-04-08 NOTE — Telephone Encounter (Signed)
[facility] [place] called report for venous doppler; negative. Pt waiting, Herbert Seta said tell pt negative and can go home.

## 2012-04-08 NOTE — Telephone Encounter (Signed)
Noted  

## 2012-04-08 NOTE — Patient Instructions (Addendum)
Start diclofenac for pain and possible nerve irritation in spine. We will call you with X-ray results and ultrasound results... Stop at front desk to have [place] scheduled. If not improving ..in next 1-2 weeks.. Follow up with primary care MD.

## 2012-04-08 NOTE — Telephone Encounter (Signed)
Pt walked in with rt leg numbness with warmth from hip to heel since 04/05/12; today burning shooting pain in rt groin to upper thigh; pt favoring leg no weakness noted. Dr Ermalene Searing will see.

## 2012-04-08 NOTE — Telephone Encounter (Signed)
Noted, thanks to all involved.

## 2012-04-08 NOTE — Assessment & Plan Note (Signed)
Will eval with lumbar plain films.

## 2012-11-14 ENCOUNTER — Ambulatory Visit (INDEPENDENT_AMBULATORY_CARE_PROVIDER_SITE_OTHER): Payer: 59 | Admitting: Family Medicine

## 2012-11-14 ENCOUNTER — Encounter: Payer: Self-pay | Admitting: Family Medicine

## 2012-11-14 VITALS — BP 120/60 | HR 64 | Temp 98.8°F | Wt 130.0 lb

## 2012-11-14 DIAGNOSIS — Z202 Contact with and (suspected) exposure to infections with a predominantly sexual mode of transmission: Secondary | ICD-10-CM | POA: Insufficient documentation

## 2012-11-14 DIAGNOSIS — Z2089 Contact with and (suspected) exposure to other communicable diseases: Secondary | ICD-10-CM

## 2012-11-14 MED ORDER — METRONIDAZOLE 500 MG PO TABS: 500.0000 mg | ORAL_TABLET | Freq: Two times a day (BID) | ORAL | Status: DC

## 2012-11-14 NOTE — Progress Notes (Signed)
  Subjective:    Patient ID: Juan Foster, male    DOB: 12/24/1980, 33 y.o.   MRN: [id number]  HPI CC: STD check  Wife just diagnosed with trichomonas.    Pt and wife both deny infidelity.  Pt denies sxs - no dysuria, urgency, frequency, abd pain, lower back pain, no urethral discharge. Occasional GI upset.  Recent void.  No h/o STDs.  Has had testing in past but desires rpt testing today.  Past Medical History  Diagnosis Date  . 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 History of chicken pox      Review of Systems Per HPI    Objective:   Physical Exam  Nursing note and vitals reviewed. Constitutional: He appears well-developed and well-nourished. No distress.  Genitourinary: Penis normal. Circumcised. No penile tenderness. No discharge found.   Urethral swab collected    Assessment & Plan:

## 2012-11-14 NOTE — Assessment & Plan Note (Signed)
Wife recently dx with trichomonas. Treat with 1wk flagyl course (500mg  bid).  Wife also currently being treated. Pt requests checking of other STDs - sent urethral swab for CT/GC, check HIV/RPR today. Discussed trich may lie dormant for months but needs to have clear discussion with wife re: dx. Handout on trich provided today.

## 2012-11-14 NOTE — Patient Instructions (Signed)
Treat with flagyl twice daily for 7 days. Testing done today.  Trichomoniasis Trichomoniasis is an infection, caused by the Trichomonas organism, that affects both women and men. In women, the outer male genitalia and the vagina are affected. In men, the penis is mainly affected, but the prostate and other reproductive organs can also be involved. Trichomoniasis is a sexually transmitted disease (STD) and is most often passed to another person through sexual contact. The majority of people who get trichomoniasis do so from a sexual encounter and are also at risk for other STDs. CAUSES   Sexual intercourse with an infected partner.  It can be present in swimming pools or hot tubs. SYMPTOMS   Abnormal gray-green frothy vaginal discharge in women.  Vaginal itching and irritation in women.  Itching and irritation of the area outside the vagina in women.  Penile discharge with or without pain in males.  Inflammation of the urethra (urethritis), causing painful urination.  Bleeding after sexual intercourse. RELATED COMPLICATIONS  Pelvic inflammatory disease.  Infection of the uterus (endometritis).  Infertility.  Tubal (ectopic) pregnancy.  It can be associated with other STDs, including gonorrhea and chlamydia, hepatitis B, and HIV. COMPLICATIONS DURING PREGNANCY  Early (premature) delivery.  Premature rupture of the membranes (PROM).  Low birth weight. DIAGNOSIS   Visualization of Trichomonas under the microscope from the vagina discharge.  Ph of the vagina greater than 4.5, tested with a test tape.  Trich Rapid Test.  Culture of the organism, but this is not usually needed.  It may be found on a Pap test.  Having a "strawberry cervix,"which means the cervix looks very red like a strawberry. TREATMENT   You may be given medication to fight the infection. Inform your caregiver if you could be or are pregnant. Some medications used to treat the infection should not  be taken during pregnancy.  Over-the-counter medications or creams to decrease itching or irritation may be recommended.  Your sexual partner will need to be treated if infected. HOME CARE INSTRUCTIONS   Take all medication prescribed by your caregiver.  Take over-the-counter medication for itching or irritation as directed by your caregiver.  Do not have sexual intercourse while you have the infection.  Do not douche or wear tampons.  Discuss your infection with your partner, as your partner may have acquired the infection from you. Or, your partner may have been the person who transmitted the infection to you.  Have your sex partner examined and treated if necessary.  Practice safe, informed, and protected sex.  See your caregiver for other STD testing. SEEK MEDICAL CARE IF:   You still have symptoms after you finish the medication.  You have an oral temperature above 102 F (38.9 C).  You develop belly (abdominal) pain.  You have pain when you urinate.  You have bleeding after sexual intercourse.  You develop a rash.  The medication makes you sick or makes you throw up (vomit). Document Released: 08/15/2000 Document Revised: 05/14/2011 Document Reviewed: 09/10/2008 [facility] Patient Information 2014 [place], [place].

## 2012-11-15 LAB — HIV ANTIBODY (ROUTINE TESTING W REFLEX): HIV: NONREACTIVE

## 2012-11-17 ENCOUNTER — Encounter: Payer: Self-pay | Admitting: Family Medicine

## 2012-11-17 ENCOUNTER — Telehealth: Payer: Self-pay

## 2012-11-17 NOTE — Telephone Encounter (Signed)
Spoke with patient, discussed concerns. 

## 2012-11-17 NOTE — Telephone Encounter (Signed)
Spoke with patient and he advised that he now wishes he had been tested and had results. Is there anyway that this test can be added to the sample we collected at last visit? He asks that you also give him a call at [phone].

## 2012-11-17 NOTE — Telephone Encounter (Signed)
Message left for patient to return my call.  

## 2012-11-17 NOTE — Telephone Encounter (Signed)
Pt left v/m was seen 11/14/12 and request trichomonas results; my chart did not list trichomonas testing.Please advise.

## 2012-11-17 NOTE — Telephone Encounter (Signed)
We did not test for trichomonas - discussed at office visit.  We decided to treat regardless.

## 2012-11-17 NOTE — Telephone Encounter (Signed)
Pt left v/m requesting cb [phone].

## 2012-11-17 NOTE — Telephone Encounter (Signed)
Spoke with patient.

## 2012-11-21 ENCOUNTER — Encounter: Payer: Self-pay | Admitting: Family Medicine

## 2012-11-21 ENCOUNTER — Ambulatory Visit (INDEPENDENT_AMBULATORY_CARE_PROVIDER_SITE_OTHER): Payer: 59 | Admitting: Family Medicine

## 2012-11-21 VITALS — BP 92/54 | HR 58 | Temp 97.5°F | Wt 128.8 lb

## 2012-11-21 DIAGNOSIS — Z2089 Contact with and (suspected) exposure to other communicable diseases: Secondary | ICD-10-CM

## 2012-11-21 DIAGNOSIS — Z202 Contact with and (suspected) exposure to infections with a predominantly sexual mode of transmission: Secondary | ICD-10-CM

## 2012-11-21 DIAGNOSIS — B079 Viral wart, unspecified: Secondary | ICD-10-CM

## 2012-11-21 NOTE — Patient Instructions (Addendum)
Juan Foster is a common infection that can lie dormant for a long period without symptoms.   I don't assure the source as it can persist likely for years before it is found.  If both partners are treated at the same time and other tests are negative, then no follow up is usually needed. I don't test the patient for the trich when the partner was positive.  I just treat the patient.

## 2012-11-21 NOTE — Progress Notes (Signed)
Wife recently dx'd with trich.  Pt was treated.  Urine wasn't checked for trich and I d/w pt.  This was medically appropriate as it wouldn't have changed mgmt.  "We trust each other and we didn't go off the deep end."  They are going to a psychologist for counseling and I support this.  We talked about not needing follow up testing.  He and his wife both took the treatment.  I told him that trich can persist for an extended period of time if untreated.    8 warts on L hand and 1 on R hand.  Wants treatment.    Meds, vitals, and allergies reviewed.   ROS: See HPI.  Otherwise, noncontributory.  nad 8 warts on L hand and 1 on R hand.  Frozen with liqN2

## 2012-11-23 DIAGNOSIS — B079 Viral wart, unspecified: Secondary | ICD-10-CM | POA: Insufficient documentation

## 2012-11-23 NOTE — Assessment & Plan Note (Signed)
8 warts on L hand and 1 on R hand treated each x3 with liq N2, tolerated well. Pt aware of possible treatment failure, fu prn.  He agrees.

## 2012-11-23 NOTE — Assessment & Plan Note (Signed)
D/w pt.  Counseling is reasonable.  He understood Dr. Timoteo Expose rationale and felt better after the conversation.  I appreciate help from Dr. Reece Agar.

## 2012-12-14 ENCOUNTER — Emergency Department: Payer: Self-pay | Admitting: Emergency Medicine

## 2012-12-26 ENCOUNTER — Ambulatory Visit (INDEPENDENT_AMBULATORY_CARE_PROVIDER_SITE_OTHER)
Admission: RE | Admit: 2012-12-26 | Discharge: 2012-12-26 | Disposition: A | Discharge status: Home / Self Care | Payer: 59 | Source: Ambulatory Visit | Attending: Family Medicine | Admitting: Family Medicine

## 2012-12-26 ENCOUNTER — Ambulatory Visit (INDEPENDENT_AMBULATORY_CARE_PROVIDER_SITE_OTHER): Payer: 59 | Admitting: Family Medicine

## 2012-12-26 ENCOUNTER — Encounter: Payer: Self-pay | Admitting: Family Medicine

## 2012-12-26 VITALS — BP 118/62 | HR 67 | Temp 98.4°F | Wt 131.5 lb

## 2012-12-26 DIAGNOSIS — M25579 Pain in unspecified ankle and joints of unspecified foot: Secondary | ICD-10-CM | POA: Insufficient documentation

## 2012-12-26 DIAGNOSIS — M25571 Pain in right ankle and joints of right foot: Secondary | ICD-10-CM

## 2012-12-26 NOTE — Patient Instructions (Signed)
Ice your foot, wear the brace, work on range of motion exercises out of the brace at night.  Notify me if not better.  Can wean out of the brace as tolerated.  Take care.

## 2012-12-26 NOTE — Progress Notes (Signed)
R foot pain.  ~2 weeks ago was working in a garage.  150lb cylinder hit his R foot. No fx. Xrayed at ER.  Sent home in flat shoe.  Also plays on a kickball team. Didn't play last week, did play 2 days ago. Played w/o pain.  Kicks with R foot.  Did well. Later than night, stepped down on a step (no missed step) and had sudden pain. Pain at the dorsum of the proximal midfoot.  Sore since then. Can bear weight but with pain.  No pop or snap. Pain with dorsiflexion and plantarflexion.    Meds, vitals, and allergies reviewed.   ROS: See HPI.  Otherwise, noncontributory.  nad R foot with normal inspection except for mildly puffy on the dorsum of midfoot. Dorsum/extensor tendons diffusely ttp Not ttp inferior to med and lat mal Distally NV intact, normal DP pulse.  5th not ttp Mortise stable  X rays reviewed.

## 2012-12-27 NOTE — Assessment & Plan Note (Signed)
X rays reviewed, neg. Likely dorsal strain, feels better in ASO.  Would use with weight bearing, then ROM exercises at night w/o bearing weight. Ice and nsaids with GI caution.  F/u prn.  Gradually wean out of ASO.  He agrees. F/u prn.

## 2013-01-08 ENCOUNTER — Other Ambulatory Visit: Payer: Self-pay

## 2014-04-29 ENCOUNTER — Ambulatory Visit (INDEPENDENT_AMBULATORY_CARE_PROVIDER_SITE_OTHER)
Admission: RE | Admit: 2014-04-29 | Discharge: 2014-04-29 | Disposition: A | Discharge status: Home / Self Care | Payer: BLUE CROSS/BLUE SHIELD | Source: Ambulatory Visit | Attending: Internal Medicine | Admitting: Internal Medicine

## 2014-04-29 ENCOUNTER — Encounter: Payer: Self-pay | Admitting: Internal Medicine

## 2014-04-29 ENCOUNTER — Ambulatory Visit (INDEPENDENT_AMBULATORY_CARE_PROVIDER_SITE_OTHER): Payer: BLUE CROSS/BLUE SHIELD | Admitting: Internal Medicine

## 2014-04-29 VITALS — BP 110/70 | HR 65 | Temp 98.2°F | Wt 134.0 lb

## 2014-04-29 DIAGNOSIS — R0789 Other chest pain: Secondary | ICD-10-CM

## 2014-04-29 DIAGNOSIS — R0981 Nasal congestion: Secondary | ICD-10-CM

## 2014-04-29 DIAGNOSIS — R0982 Postnasal drip: Secondary | ICD-10-CM

## 2014-04-29 NOTE — Patient Instructions (Signed)
Azelastine nasal spray What is this medicine? AZELASTINE (a ZEL as teen) nasal spray is a histamine blocker. It helps to relieve itching, running and stuffiness in the nose. This medicine is used to treat nasal symptoms from allergies and other irritants. This medicine may be used for other purposes; ask your health care provider or pharmacist if you have questions. COMMON BRAND NAME(S): Astelin, Astepro What should I tell my health care provider before I take this medicine? They need to know if you have any of these conditions: -any other medical problems -an unusual or allergic reaction to azelastine, other nasal sprays, medicines, foods, dyes, or preservatives -pregnant or trying to become pregnant -breast-feeding How should I use this medicine? This medicine is for use only in the nose. Follow the directions on the prescription label. Do not use more often than directed. Make sure that you are using your inhaler correctly. Ask you doctor or health care provider if you have any questions. Talk to your pediatrician regarding the use of this medicine in children. While some products may be prescribed for children as young as 6 months for selected conditions, precautions do apply. Overdosage: If you think you have taken too much of this medicine contact a poison control center or emergency room at once. NOTE: This medicine is only for you. Do not share this medicine with others. What if I miss a dose? If you miss a dose, use it as soon as you can. If it is almost time for your next dose, use only that dose. Do not use double or extra doses. What may interact with this medicine? -cimetidine -other antihistamines This list may not describe all possible interactions. Give your health care provider a list of all the medicines, herbs, non-prescription drugs, or dietary supplements you use. Also tell them if you smoke, drink alcohol, or use illegal drugs. Some items may interact with your  medicine. What should I watch for while using this medicine? Tell your doctor or health care professional if your symptoms do not improve. Do not use extra medicine. Do not spray in your eyes. This medicine may make you feel confused, dizzy or lightheaded. Drinking alcohol or taking medicine that causes drowsiness can make this worse. Do not drive, use machinery, or do anything that needs mental alertness until you know how this medicine affects you. What side effects may I notice from receiving this medicine? Side effects that you should report to your doctor or health care professional as soon as possible: -allergic reactions like skin rash, itching or hives, swelling of the face, lips, or tongue -breathing problems -fast heartbeat -high blood pressure -infection Side effects that usually do not require medical attention (report to your doctor or health care professional if they continue or are bothersome): -bitter taste -cough -feeling tired -headache -larger appetite or weight gain -nose or throat irritation -nosebleed -sneezing This list may not describe all possible side effects. Call your doctor for medical advice about side effects. You may report side effects to FDA at 1-800-FDA-1088. Where should I keep my medicine? Keep out of the reach of children. Store upright and tightly closed at room temperature between 20 and 25 degrees C (68 and 77 degrees F). Do not freeze. Throw away any unused medicine after the expiration date or after 200 sprays, whichever comes first. NOTE: This sheet is a summary. It may not cover all possible information. If you have questions about this medicine, talk to your doctor, pharmacist, or health care provider.  2015, Elsevier/Gold Standard. (2013-04-28 15:52:44)

## 2014-04-29 NOTE — Progress Notes (Signed)
Subjective:    Patient ID: Juan Foster, male    DOB: [birth year], 34 y.o.   MRN: [id number]  HPI  Pt presents to the clinic today with c/o a burning sensation in his chest. He was sitting at his desk yesterday when he felt a "pop" on the right upper side of his chest. He has pain with taking a deep breath and feels short of breath at times. He can also hear  "gurgling" noises when he takes a deep breath. He does have a history of recurrent spontaneous pneumothorax and did have a surgery to fuse his lungs to his chest wall about 10 years ago. He does reports have some nasal congestion and post nasal drip for about 1 week prior to this. He denies cough, fever or chills. He has been taking Nyquil without relief. He has no history of asthma or allergies.  Review of Systems  Past Medical History  Diagnosis Date  . 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 History of chicken pox     No current outpatient prescriptions on file.   No current facility-administered medications for this visit.    No Known Allergies  Family History  Problem Relation Age of Onset  . Sudden death Other   . COPD Paternal Grandfather   . Prostate cancer Neg Hx   . Colon cancer Neg Hx     History   Social History  . Marital Status: Single    Spouse Name: N/A  . Number of Children: N/A  . Years of Education: N/A   Occupational History  . Risk analyst    Social History Main Topics  . Smoking status: Former Smoker    Quit date: 02/13/2011  . Smokeless tobacco: Never Used  . Alcohol Use: Yes     Comment: 1 bottle of wine/week  . Drug Use: No  . Sexual Activity: Not on file   Other Topics Concern  . Not on file   Social History Narrative   Exercises at least 3 times a week.   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  HEENT: Pt reports nasal congestion and post nasal drip. Denies eye pain, eye redness, ear pain, ringing in the ears, wax buildup, runny nose, bloody nose, or sore throat. Respiratory: Pt reports burning sensation in chest. Denies difficulty breathing, cough or sputum production.   Cardiovascular: Denies chest pain, chest tightness, palpitations or swelling in the hands or feet.   No other specific complaints in a complete review of systems (except as listed in HPI above).     Objective:   Physical Exam  BP 110/70 mmHg  Pulse 65  Temp(Src) 98.2 F (36.8 C) (Oral)  Wt 134 lb (60.782 kg)  SpO2 98% Wt Readings from Last 3 Encounters:  04/29/14 134 lb (60.782 kg)  12/26/12 131 lb 8 oz (59.648 kg)  11/21/12 128 lb 12 oz (58.401 kg)    General: Appears his stated age, well developed, well nourished in NAD. Skin: Warm, dry and intact. No rashes, lesions or ulcerations noted. HEENT: Head: normal shape and size; Eyes: sclera white, no icterus, conjunctiva pink; Throat/Mouth: Teeth present, mucosa red and moist, + PND, no exudate, lesions or ulcerations noted.  Neck: No adenopathy  noted. Cardiovascular: Normal rate and rhythm. S1,S2 noted.  No murmur, rubs or gallops noted.  Pulmonary/Chest: Normal effort and positive vesicular breath sounds. No respiratory distress. No wheezes, rales or ronchi noted.   BMET    Component Value Date/Time   GLUCOSE 79 07/10/2010 1052    Lipid Panel     Component Value Date/Time   CHOL 153 07/10/2010 1052   TRIG 47.0 07/10/2010 1052   HDL 62.20 07/10/2010 1052   CHOLHDL 2 07/10/2010 1052   VLDL 9.4 07/10/2010 1052   LDLCALC 81 07/10/2010 1052       Assessment & Plan:   Post nasal drip:  The drainage from this is likely causing the burning sensation in his chest Advised him to start zyrtec and ibuprofen- both can be purchased from your local pharmacy Given history of spontaneous pneumothorax, will obtain chest xray  today- will call you with the results  RTC as needed or if symptoms persist or worsen

## 2014-04-29 NOTE — Progress Notes (Signed)
Pre visit review using our clinic review tool, if applicable. No additional management support is needed unless otherwise documented below in the visit note. 

## 2014-06-20 ENCOUNTER — Emergency Department
Admit: 2014-06-20 | Disposition: A | Discharge status: Home / Self Care | Payer: Self-pay | Admitting: Emergency Medicine

## 2014-06-20 LAB — COMPREHENSIVE METABOLIC PANEL
ALBUMIN: 5.2 g/dL — AB
ALT: 26 U/L
AST: 29 U/L
Alkaline Phosphatase: 42 U/L
Anion Gap: 9 (ref 7–16)
BUN: 8 mg/dL
Bilirubin,Total: 0.5 mg/dL
CO2: 28 mmol/L
Calcium, Total: 9.4 mg/dL
Chloride: 101 mmol/L
Creatinine: 0.92 mg/dL
Glucose: 84 mg/dL
Potassium: 4.3 mmol/L
SODIUM: 138 mmol/L
Total Protein: 8.1 g/dL

## 2014-06-20 LAB — CBC
HCT: 46.1 % (ref 40.0–52.0)
HGB: 15.4 g/dL (ref 13.0–18.0)
MCH: 32.1 pg (ref 26.0–34.0)
MCHC: 33.4 g/dL (ref 32.0–36.0)
MCV: 96 fL (ref 80–100)
PLATELETS: 186 10*3/uL (ref 150–440)
RBC: 4.79 10*6/uL (ref 4.40–5.90)
RDW: 12.9 % (ref 11.5–14.5)
WBC: 11.1 10*3/uL — AB (ref 3.8–10.6)

## 2014-06-20 LAB — TROPONIN I: TROPONIN-I: 0.03 ng/mL

## 2014-06-20 LAB — CK TOTAL AND CKMB (NOT AT ARMC)
CK, Total: 167 U/L
CK-MB: 1.1 ng/mL

## 2014-06-24 ENCOUNTER — Encounter: Payer: Self-pay | Admitting: Internal Medicine

## 2014-06-24 ENCOUNTER — Ambulatory Visit (INDEPENDENT_AMBULATORY_CARE_PROVIDER_SITE_OTHER): Payer: BLUE CROSS/BLUE SHIELD | Admitting: Internal Medicine

## 2014-06-24 ENCOUNTER — Ambulatory Visit: Payer: BLUE CROSS/BLUE SHIELD | Admitting: Family Medicine

## 2014-06-24 VITALS — BP 116/78 | HR 54 | Temp 98.1°F | Wt 139.0 lb

## 2014-06-24 DIAGNOSIS — M940 Chondrocostal junction syndrome [Tietze]: Secondary | ICD-10-CM

## 2014-06-24 DIAGNOSIS — R091 Pleurisy: Secondary | ICD-10-CM | POA: Diagnosis not present

## 2014-06-24 MED ORDER — NAPROXEN 500 MG PO TABS: 500.0000 mg | ORAL_TABLET | Freq: Three times a day (TID) | ORAL | Status: DC

## 2014-06-24 MED ORDER — OXYCODONE-ACETAMINOPHEN 5-325 MG PO TABS: 1.0000 | ORAL_TABLET | Freq: Four times a day (QID) | ORAL | Status: DC | PRN

## 2014-06-24 NOTE — Patient Instructions (Signed)

## 2014-06-24 NOTE — Progress Notes (Signed)
Pre visit review using our clinic review tool, if applicable. No additional management support is needed unless otherwise documented below in the visit note. 

## 2014-06-24 NOTE — Progress Notes (Signed)
Subjective:    Patient ID: Juan Foster, male    DOB: 08/28/1980, 34 y.o.   MRN: [id number]  HPI  Pt presents to the clinic today with c/o a gurgling and burning in his chest. He was recently seen at the [facility] ER for the same on 06/20/14. It had been going on for the past week but had worsened. Chest xray was normal. ECG was normal. Labs were normal. He was diagnosed with pleuritis and advised to take NSIADs. He was also given a limited supply of Percocet for severe pain. He has also been seen 04/29/14 for the same. He does have a history of spontaneous pneumothorax.  Review of Systems      Past Medical History  Diagnosis Date  . 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 History of chicken pox     Current Outpatient Prescriptions  Medication Sig Dispense Refill  . ibuprofen (ADVIL,MOTRIN) 800 MG tablet Take 800 mg by mouth 3 (three) times daily.     No current facility-administered medications for this visit.    No Known Allergies  Family History  Problem Relation Age of Onset  . Sudden death Other   . COPD Paternal Grandfather   . Prostate cancer Neg Hx   . Colon cancer Neg Hx     History   Social History  . Marital Status: Married    Spouse Name: N/A  . Number of Children: N/A  . Years of Education: N/A   Occupational History  . Risk analyst    Social History Main Topics  . Smoking status: Former Smoker    Quit date: 02/13/2011  . Smokeless tobacco: Never Used  . Alcohol Use: 0.0 oz/week    0 Standard drinks or equivalent per week     Comment: 1 bottle of wine/week  . Drug Use: No  . Sexual Activity: Not on file   Other Topics Concern  . Not on file   Social History Narrative   Exercises at least 3 times a week.   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    Respiratory: Pt reports burning sensation in chest. Denies difficulty breathing, shortness of breath, cough or sputum production.   Cardiovascular: Denies chest pain, chest tightness, palpitations or swelling in the hands or feet.  Musculoskeletal: Pt reports chest wall pain. Denies decrease in range of motion, difficulty with gait, muscle pain or joint pain and swelling.   No other specific complaints in a complete review of systems (except as listed in HPI above).  Objective:   Physical Exam  BP 116/78 mmHg  Pulse 54  Temp(Src) 98.1 F (36.7 C) (Oral)  Wt 139 lb (63.05 kg)  SpO2 98% Wt Readings from Last 3 Encounters:  06/24/14 139 lb (63.05 kg)  04/29/14 134 lb (60.782 kg)  12/26/12 131 lb 8 oz (59.648 kg)    General: Appears his stated age, well developed, well nourished in NAD. Skin: Warm, dry and intact. No rashes, lesions or ulcerations noted..  Cardiovascular: Normal rate and rhythm. S1,S2 noted.  No murmur, rubs or gallops noted.  Pulmonary/Chest: Normal effort and positive vesicular breath sounds. No respiratory distress. No wheezes, rales or ronchi noted.  Musculoskeletal: Tender to palpation in the left chest  in the intercostal spaces.    BMET    Component Value Date/Time   GLUCOSE 79 07/10/2010 1052    Lipid Panel     Component Value Date/Time   CHOL 153 07/10/2010 1052   TRIG 47.0 07/10/2010 1052   HDL 62.20 07/10/2010 1052   CHOLHDL 2 07/10/2010 1052   VLDL 9.4 07/10/2010 1052   LDLCALC 81 07/10/2010 1052    CBC No results found for: WBC, RBC, HGB, HCT, PLT, MCV, MCH, MCHC, RDW, LYMPHSABS, MONOABS, EOSABS, BASOSABS  Hgb A1C No results found for: HGBA1C       Assessment & Plan:   Hospital follow up for pleuritis:  ER notes, labs and imaging reviewed This still sounds like pleurisy with some costochondritis Stop Ibuprofen eRx for Naproxen 500 mg TID with meals, no additional Advil or Aleve with this Percocet refilled, only # 20, no  additional refills Follow up with PCP in 1 week if pain persist   RTC as needed or if symptoms persist or worsen

## 2014-06-30 ENCOUNTER — Encounter: Payer: Self-pay | Admitting: Family Medicine

## 2014-06-30 ENCOUNTER — Ambulatory Visit (INDEPENDENT_AMBULATORY_CARE_PROVIDER_SITE_OTHER): Payer: BLUE CROSS/BLUE SHIELD | Admitting: Family Medicine

## 2014-06-30 VITALS — BP 110/70 | HR 60 | Temp 98.6°F | Wt 138.0 lb

## 2014-06-30 DIAGNOSIS — M940 Chondrocostal junction syndrome [Tietze]: Secondary | ICD-10-CM | POA: Diagnosis not present

## 2014-06-30 DIAGNOSIS — R091 Pleurisy: Secondary | ICD-10-CM

## 2014-06-30 MED ORDER — OXYCODONE-ACETAMINOPHEN 5-325 MG PO TABS: 1.0000 | ORAL_TABLET | Freq: Four times a day (QID) | ORAL | Status: DC | PRN

## 2014-06-30 NOTE — Progress Notes (Signed)
Pre visit review using our clinic review tool, if applicable. No additional management support is needed unless otherwise documented below in the visit note.  Ho PTX x5 in distant past with recent ER visit for chest pain, dx'd pleursy.  Worsening after that, had f/u here last week.  Some better in the last week, not resolved.  Percocet dried his mouth some, compensating with drinking extra water. More recently with allergy sx, taking zyrtec for rhinorrhea.  He prev had to shallow breath to compensate for the pain.    L chest wall pain, no R sided sx.  Not SOB now.  No FCNAVD.  No rash.    Before this episode happened, he got elbowed in the chest with soccer.  It was sore thereafter but he did well overall.  He felt well enough to play the next week, but then had to stop after the 1st half due to pain, as above, then to ER as he worsened, etc.    Meds, vitals, and allergies reviewed.   ROS: See HPI.  Otherwise, noncontributory.  nad ncat Mmm Neck supple rrr Ctab, no wheeze, no focal dec in BS abd soft

## 2014-06-30 NOTE — Patient Instructions (Addendum)
Juan Foster will call about your referral. Take care.  Glad to see you.   Use the pain medicine if needed.

## 2014-07-01 DIAGNOSIS — R091 Pleurisy: Secondary | ICD-10-CM | POA: Insufficient documentation

## 2014-07-01 NOTE — Assessment & Plan Note (Addendum)
And/or costochondritis.  D/w pt.  He's had PTX x5 prev.  I'm not clear why he had such sig pain with this flare.  I gave him an extra rx for percocet, in case he had another flare.  I think it is reasonable to get pulmonary input, just to see if they have any other advice.  No need to rexray at this point, he agrees.   Flu shot encouraged for fall.

## 2014-08-09 ENCOUNTER — Encounter: Payer: Self-pay | Admitting: Internal Medicine

## 2014-08-09 ENCOUNTER — Ambulatory Visit (INDEPENDENT_AMBULATORY_CARE_PROVIDER_SITE_OTHER): Payer: BLUE CROSS/BLUE SHIELD | Admitting: Internal Medicine

## 2014-08-09 VITALS — BP 110/60 | HR 60 | Temp 97.8°F | Ht 72.0 in | Wt 137.8 lb

## 2014-08-09 DIAGNOSIS — R091 Pleurisy: Secondary | ICD-10-CM

## 2014-08-09 DIAGNOSIS — R0600 Dyspnea, unspecified: Secondary | ICD-10-CM

## 2014-08-09 NOTE — Progress Notes (Signed)
Date: 08/09/2014  MRN# [id number] Juan Foster 11-Jun-1980  Referring Physician:   MEIR JENTSCH is a 34 y.o. old male seen in consultation for pleurisy and shortness of breath  CC:  Chief Complaint  Patient presents with  . Advice Only    Referred by Dr.Graham Para March. Pt had pleurisy he says if he breaths deeply he can feel burning in chest. Pt does have constant chest tightness.    HPI:  She is a pleasant 34 year old male presents today for evaluation of pleurisy and intermittent episodes of shortness of breath. Patient hasn't significant history of pneumothorax 5, 3 times on the right and 2 times on the left. 2005 he had bilateral pleurodesis and has been doing well until this year. He states that in the past when he had a pneumothorax he would have sudden onset of shortness of breath accompanied with chest discomfort/chest pain, which is described as a burning in his chest. When February of this year while at work sitting, he had a similar episode that lasted a few seconds. In the last month and a half he is very active and play soccer, doing well on his soccer matches he experiences similar type of pain especially after having a elbow to the chest. He follow-up with his primary care physician who got a chest x-ray which was essentially normal. He was also given Percocet which did help with the chest pain. Other than being tall and thin there is no organic etiology for his spontaneous pneumothoraces in the past. He was referred today for further evaluation of pleurisy and shortness of breath. About 4 weeks ago while playing soccer without any trauma or injury started to develop shortness of breath again, to the point where he had to stop right away. There is no family history of asthma, spontaneous pneumothorax, Ehalos-Dhanlos syndrome, Marfan's in the family that he could recall. He states that his grandfather had COPD when he died. Today he describes chest discomfort mostly in the left  upper chest, with deep inspiration and palpation. He states that he smoked on and off for about 8 years, about 4-56 per day. He stopped smoking completely in 2012. Patient states that he he occasionally/socially uses E cigarettes maybe once a month when he is out with friends. Patient states that he believe he had talc pleurodesis.  PMHX:   Past Medical History  Diagnosis Date  . 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 History of chicken pox    Surgical Hx:  Past Surgical History  Procedure Laterality Date  . Surgery to fuse lungs to cavity wall to prevent collapse  2006   Family Hx:  Family History  Problem Relation Age of Onset  . Sudden death Other   . COPD Paternal Grandfather   . Prostate cancer Neg Hx   . Colon cancer Neg Hx    Social Hx:   History  Substance Use Topics  . Smoking status: Former Smoker    Quit date: 02/13/2011  . Smokeless tobacco: Never Used  . Alcohol Use: 0.0 oz/week    0 Standard drinks or equivalent per week     Comment: 1 bottle of wine/week   Medication:   Current Outpatient Rx  Name  Route  Sig  Dispense  Refill  . cetirizine (ZYRTEC) 10 MG tablet   Oral   Take 10 mg by mouth daily.         Marland Kitchen  naproxen (NAPROSYN) 500 MG tablet   Oral   Take 1 tablet (500 mg total) by mouth 3 (three) times daily with meals. Patient taking differently: Take 500 mg by mouth 3 (three) times daily as needed.    90 tablet   0   . oxyCODONE-acetaminophen (PERCOCET/ROXICET) 5-325 MG per tablet   Oral   Take 1 tablet by mouth every 6 (six) hours as needed for severe pain.   30 tablet   0       Allergies:  Review of patient's allergies indicates no known allergies.  Review of Systems: Gen:  Denies  fever, sweats, chills HEENT: Denies blurred vision, double vision, ear pain, eye pain, hearing loss, nose bleeds, sore throat Cvc:  No dizziness, chest pain or heaviness Resp:   Chest wall pain and shortness of breath Gi:  Denies swallowing difficulty, stomach pain, nausea or vomiting, diarrhea, constipation, bowel incontinence Gu:  Denies bladder incontinence, burning urine Ext:   No Joint pain, stiffness or swelling Skin: No skin rash, easy bruising or bleeding or hives Endoc:  No polyuria, polydipsia , polyphagia or weight change Psych: No depression, insomnia or hallucinations  Other:  All other systems negative  Physical Examination:   VS: BP 110/60 mmHg  Pulse 60  Temp(Src) 97.8 F (36.6 C) (Oral)  Ht 6' (1.829 m)  Wt 137 lb 12.8 oz (62.506 kg)  BMI 18.69 kg/m2  SpO2 97%  General Appearance: No distress, tall thin appearing male in no acute respiratory distress Neuro:without focal findings, mental status, speech normal, alert and oriented, cranial nerves 2-12 intact, reflexes normal and symmetric, sensation grossly normal  HEENT: PERRLA, EOM intact, no ptosis, no other lesions noticed; Mallampati 2 Chest wall - scars on the L anterior chestwall, 3 scars on the right anterior chestwall.  L chest wall tenderness on deep inspiration with palpation Pulmonary: normal breath sounds., diaphragmatic excursion normal.No wheezing, No rales;   Sputum Production:  none CardiovascularNormal S1,S2.  No m/r/g.  Abdominal aorta pulsation normal.    Abdomen: Benign, Soft, non-tender, No masses, hepatosplenomegaly, No lymphadenopathy Renal:  No costovertebral tenderness  GU:  No performed at this time. Endoc: No evident thyromegaly, no signs of acromegaly or Cushing features Skin:   warm, no rashes, no ecchymosis  Extremities: normal, no cyanosis, clubbing, no edema, warm with normal capillary refill. Other findings: None     Rad results: (The following images and results were reviewed by Dr. Dema Severin). CXR 04/2014 FINDINGS: Again noted are surgical lung staples in the upper chest bilaterally. There is no evidence for a pneumothorax. Heart and mediastinum are within normal limits. Again noted is  mild hyperinflation. Both lungs are clear. No acute bone abnormality.  IMPRESSION: No active cardiopulmonary disease.  CXR 06/2014 - Similar as x-ray in February 2016, no acute findings.     Assessment and Plan: 34 year old male with past medical history of spontaneous pneumothorax 5, status post top pleurodesis in 2005, seen in consultation for pleurisy and dyspnea. Dyspnea Differential diagnosis includes: Asthma, restrictive disease, inflammatory process, inflammation from recent trauma.  Given the results of the recent chest x-ray, along with having a history of bilateral type pleurodesis, I doubt this is another episode of pneumothorax. Patient could be having new onset, all early signs of asthma.  Plan: -Given the recent history of trauma to his chest wall from playing soccer, along with history of spontaneous pneumothorax in the past, I believe that obtaining a CT scan of the chest without contrast is reasonable. -  Further workup with pulmonary function testing and 6 minute walk test. Methacholine challenge is not advise at this time given his clinical status.    Pleurisy Etiology of this left chest wall pain is unknown at this time. Even though patient had bilateral pleurodesis, small pneumothoraces still can develop in rare cases. Patient may have had a small pneumothorax which spontaneously resolved by the time the 2 x-rays were done; however this exhalation is very low on the differential.  At this time I have advised patient to avoid any significant strenuous activity, especially activities or sports that include direct physical contact.  Plan: -CT scan of chest without contrast to further evaluate for any underlying small pneumothoraces or possible apical blebs in his lung parenchyma. -See plan for dyspnea       Updated Medication List Outpatient Encounter Prescriptions as of 08/09/2014  Medication Sig  . cetirizine (ZYRTEC) 10 MG tablet Take 10 mg by mouth  daily.  . naproxen (NAPROSYN) 500 MG tablet Take 1 tablet (500 mg total) by mouth 3 (three) times daily with meals. (Patient taking differently: Take 500 mg by mouth 3 (three) times daily as needed. )  . oxyCODONE-acetaminophen (PERCOCET/ROXICET) 5-325 MG per tablet Take 1 tablet by mouth every 6 (six) hours as needed for severe pain.   No facility-administered encounter medications on file as of 08/09/2014.    Orders for this visit: Orders Placed This Encounter  Procedures  . CT Chest Wo Contrast    Standing Status: Future     Number of Occurrences:      Standing Expiration Date: 10/09/2015    Scheduling Instructions:     Please schedule earliest appt available 7 am    Order Specific Question:  Reason for Exam (SYMPTOM  OR DIAGNOSIS REQUIRED)    Answer:  pleurisy    Order Specific Question:  Preferred imaging location?    Answer:  [facility]  . Pulmonary function test    Standing Status: Future     Number of Occurrences:      Standing Expiration Date: 08/09/2015    Order Specific Question:  Where should this test be performed?    Answer:  [facility]    Order Specific Question:  Full PFT: includes the following: basic spirometry, spirometry pre & post bronchodilator, diffusion capacity (DLCO), lung volumes    Answer:  Full PFT    Order Specific Question:  MIP/MEP    Answer:  No    Order Specific Question:  6 minute walk    Answer:  Yes    Order Specific Question:  ABG    Answer:  No    Order Specific Question:  Diffusion capacity (DLCO)    Answer:  No    Order Specific Question:  Lung volumes    Answer:  No    Order Specific Question:  Methacholine challenge    Answer:  No     Thank  you for the consultation and for allowing [facility] to assist in the care of your patient. Our recommendations are noted above.  Please contact us if we can be of further service.   Stephanie Acre, MD [facility] Office Number: [phone]

## 2014-08-09 NOTE — Assessment & Plan Note (Signed)
Etiology of this left chest wall pain is unknown at this time. Even though patient had bilateral pleurodesis, small pneumothoraces still can develop in rare cases. Patient may have had a small pneumothorax which spontaneously resolved by the time the 2 x-rays were done; however this exhalation is very low on the differential.  At this time I have advised patient to avoid any significant strenuous activity, especially activities or sports that include direct physical contact.  Plan: -CT scan of chest without contrast to further evaluate for any underlying small pneumothoraces or possible apical blebs in his lung parenchyma. -See plan for dyspnea

## 2014-08-09 NOTE — Patient Instructions (Signed)
Up with Dr. Tinnie Gens in 2 weeks -CT chest without contrast this week -Pulmonary function testing and 6 minute walk test prior to follow-up -Avoid strenuous activity or contact sports until CT results and pulmonary results have been discussed which your provider

## 2014-08-09 NOTE — Assessment & Plan Note (Signed)
Differential diagnosis includes: Asthma, restrictive disease, inflammatory process, inflammation from recent trauma.  Given the results of the recent chest x-ray, along with having a history of bilateral type pleurodesis, I doubt this is another episode of pneumothorax. Patient could be having new onset, all early signs of asthma.  Plan: -Given the recent history of trauma to his chest wall from playing soccer, along with history of spontaneous pneumothorax in the past, I believe that obtaining a CT scan of the chest without contrast is reasonable. - Further workup with pulmonary function testing and 6 minute walk test. Methacholine challenge is not advise at this time given his clinical status.

## 2014-08-13 ENCOUNTER — Ambulatory Visit
Admission: RE | Admit: 2014-08-13 | Discharge: 2014-08-13 | Disposition: A | Discharge status: Home / Self Care | Payer: BLUE CROSS/BLUE SHIELD | Source: Ambulatory Visit | Attending: Internal Medicine | Admitting: Internal Medicine

## 2014-08-13 ENCOUNTER — Telehealth: Payer: Self-pay | Admitting: Internal Medicine

## 2014-08-13 DIAGNOSIS — R091 Pleurisy: Secondary | ICD-10-CM

## 2014-08-13 DIAGNOSIS — R0600 Dyspnea, unspecified: Secondary | ICD-10-CM

## 2014-08-13 DIAGNOSIS — R918 Other nonspecific abnormal finding of lung field: Secondary | ICD-10-CM | POA: Diagnosis not present

## 2014-08-13 NOTE — Telephone Encounter (Signed)
Per VM Ct looks good...old post operative changes. No Pneumothorax. Pt may play soccer at his own risk. We will see him on 6/22 for 2 week f/u.

## 2014-08-13 NOTE — Telephone Encounter (Signed)
Pt has called asking if he can play soccer this afternoon. He said he was waiting for the results of the CT scan. I informed pt that I was sending you the message but if he didn't hear from you by game time not to play. Please advise.

## 2014-08-25 ENCOUNTER — Encounter: Payer: Self-pay | Admitting: Internal Medicine

## 2014-08-25 ENCOUNTER — Ambulatory Visit (INDEPENDENT_AMBULATORY_CARE_PROVIDER_SITE_OTHER): Payer: BLUE CROSS/BLUE SHIELD | Admitting: Internal Medicine

## 2014-08-25 VITALS — BP 116/72 | HR 74 | Temp 97.6°F | Ht 72.0 in | Wt 137.0 lb

## 2014-08-25 DIAGNOSIS — R091 Pleurisy: Secondary | ICD-10-CM

## 2014-08-25 NOTE — Assessment & Plan Note (Signed)
Etiology of this left chest wall pain is most likely musculoskeletal in nature. At today's visit he was reproducible with deep inspiration with palpation. I believe that his initial event of upper spray tract infection with intense coughing may have caused some costal tenderness and possible mild muscle tear. Since then has been a ongoing cyclical issue with him. I have advised him to avoid contact sports for the next 2 months and any sudden movement movement such as playing golf, swimming, tennis, even sudden movements that may involve stretching. Patient states that he has ibuprofen 800 mg tablets at home from his previous primary care visit that he continues for pain management.  Recent CT scan were no pneumothorax, there are some diffuse scattered pulmonary nodules that are most likely intrapulmonary nodules from postoperative changes, low risk for malignancy in this patient with this age.  Plan: -Ibuprofen 800 mg 1 tab 3 times a day for 3 days then stop. May repeat if pain continues, did not use continuously for more in 7 days. -Avoid contact sports, no tenderness, no golfing, no soccer, no basketball, no football, etc.

## 2014-08-25 NOTE — Progress Notes (Signed)
MRN# [id number] Juan Foster 04/25/1980   CC: Chief Complaint  Patient presents with  . Follow-up    CT results/ shoulder went numb after about 10 min of playing, sharp pain in back and chest, hurt to breath and move head;       Brief History: 08/09/14 HPI She is a pleasant 34 year old male presents today for evaluation of pleurisy and intermittent episodes of shortness of breath. Patient hasn't significant history of pneumothorax 5, 3 times on the right and 2 times on the left. 2005 he had bilateral pleurodesis and has been doing well until this year. He states that in the past when he had a pneumothorax he would have sudden onset of shortness of breath accompanied with chest discomfort/chest pain, which is described as a burning in his chest. When February of this year while at work sitting, he had a similar episode that lasted a few seconds. In the last month and a half he is very active and play soccer, doing well on his soccer matches he experiences similar type of pain especially after having a elbow to the chest. He follow-up with his primary care physician who got a chest x-ray which was essentially normal. He was also given Percocet which did help with the chest pain. Other than being tall and thin there is no organic etiology for his spontaneous pneumothoraces in the past. He was referred today for further evaluation of pleurisy and shortness of breath. About 4 weeks ago while playing soccer without any trauma or injury started to develop shortness of breath again, to the point where he had to stop right away. There is no family history of asthma, spontaneous pneumothorax, Ehalos-Dhanlos syndrome, Marfan's in the family that he could recall. He states that his grandfather had COPD when he died. Today he describes chest discomfort mostly in the left upper chest, with deep inspiration and palpation. He states that he smoked on and off for about 8 years, about 4-56 per day. He stopped  smoking completely in 2012. Patient states that he he occasionally/socially uses E cigarettes maybe once a month when he is out with friends. Patient states that he believe he had talc pleurodesis. Plan - no contact sports, repeat ct chest  Events since last clinic visit: Patient presents today for follow-up visit, and to discuss results of CT scan. At his last visit he was complaining of left-sided chest wall pain, concern for pneumothorax since he has a history of spontaneous pneumothorax status post pleurodesis in the past. Repeat CT showed intrapulmonary nodules and postoperative changes, but no significant pneumothorax. He did play soccer for one day with no further issues, last week he did practice playing soccer, and suddenly had left-sided chest wall tenderness and shortness of breath, which she describes as "somebody putting a official controlling it in me." He states that since then the pain has gradually subsided, but it is tender to touch, and worse with deep breathing.    Medication:   Current Outpatient Rx  Name  Route  Sig  Dispense  Refill  . cetirizine (ZYRTEC) 10 MG tablet   Oral   Take 10 mg by mouth daily.         . naproxen (NAPROSYN) 500 MG tablet   Oral   Take 1 tablet (500 mg total) by mouth 3 (three) times daily with meals. Patient taking differently: Take 500 mg by mouth 3 (three) times daily as needed.    90 tablet   0  Review of Systems: Gen:  Denies  fever, sweats, chills HEENT: Denies blurred vision, double vision, ear pain, eye pain, hearing loss, nose bleeds, sore throat Cvc:  No dizziness, chest pain or heaviness Resp:   Admits to: Shortness of breath with chest wall pain Gi: Denies swallowing difficulty, stomach pain, nausea or vomiting, diarrhea, constipation, bowel incontinence Gu:  Denies bladder incontinence, burning urine Ext:   No Joint pain, stiffness or swelling Skin: No skin rash, easy bruising or bleeding or hives Endoc:  No  polyuria, polydipsia , polyphagia or weight change Other:  All other systems negative  Allergies:  Review of patient's allergies indicates no known allergies.  Physical Examination:  VS: BP 116/72 mmHg  Pulse 74  Temp(Src) 97.6 F (36.4 C) (Oral)  Ht 6' (1.829 m)  Wt 137 lb (62.143 kg)  BMI 18.58 kg/m2  SpO2 97%  General Appearance: No distress  HEENT: PERRLA, no ptosis, no other lesions noticed Pulmonary:normal breath sounds., diaphragmatic excursion normal.No wheezing, No rales   Chest wall: Deep inspiration elicited tenderness between fifth and sixth intercostal space anteriorly, this was reproduced with palpation. Cardiovascular:  Normal S1,S2.  No m/r/g.     Abdomen:Exam: Benign, Soft, non-tender, No masses  Skin:   warm, no rashes, no ecchymosis  Extremities: normal, no cyanosis, clubbing, warm with normal capillary refill.      Rad results: (The following images and results were reviewed by Dr. Dema Severin). CT 08/13/14 FINDINGS: Mediastinum/Lymph Nodes: Heart size is normal. There is no significant pericardial fluid, thickening or pericardial calcification. No pathologically enlarged mediastinal or hilar lymph nodes. Please note that accurate exclusion of hilar adenopathy is limited on noncontrast CT scans. Esophagus is unremarkable in appearance. No axillary lymphadenopathy.  Lungs/Pleura: No pneumothorax. No acute consolidative airspace disease. No pleural effusions. Several linear opacities in the lungs bilaterally, some of which appear partially calcified, likely postoperative (potentially old suture lines). Several small pulmonary nodules are noted in the right lung are, largest of which is in the 8 x 4 mm subpleural nodule associated with the right major fissure in the right upper lobe (image 33 of series 3). There is also a 6 x 4 mm subpleural nodule in the medial aspect of the right upper lobe (image 34 of series 3). As well, there is a 4 mm nodule in the  superior segment of the right lower lobe (image 27 of series 3), which is unchanged compared to remote prior examination from 03/23/2003, considered benign.  Upper Abdomen: Unremarkable.  Musculoskeletal/Soft Tissues: There are no aggressive appearing lytic or blastic lesions noted in the visualized portions of the skeleton.  IMPRESSION: 1. No acute findings to account for the patient's symptoms. Specifically, no evidence of recurrent spontaneous pneumothorax. 2. Probable postoperative changes in the lungs bilaterally, as above. 3. Several tiny pulmonary nodules are noted in the right lung, nonspecific, but statistically likely benign in this young patient, presumably subpleural lymph nodes or other benign lesions.    Assessment and Plan: 34 year old male past medical history of spontaneous pneumothorax seen in consultation for follow-up visit. Pleurisy Etiology of this left chest wall pain is most likely musculoskeletal in nature. At today's visit he was reproducible with deep inspiration with palpation. I believe that his initial event of upper spray tract infection with intense coughing may have caused some costal tenderness and possible mild muscle tear. Since then has been a ongoing cyclical issue with him. I have advised him to avoid contact sports for the next 2 months and any  sudden movement movement such as playing golf, swimming, tennis, even sudden movements that may involve stretching. Patient states that he has ibuprofen 800 mg tablets at home from his previous primary care visit that he continues for pain management.  Recent CT scan were no pneumothorax, there are some diffuse scattered pulmonary nodules that are most likely intrapulmonary nodules from postoperative changes, low risk for malignancy in this patient with this age.  Plan: -Ibuprofen 800 mg 1 tab 3 times a day for 3 days then stop. May repeat if pain continues, did not use continuously for more in 7  days. -Avoid contact sports, no tenderness, no golfing, no soccer, no basketball, no football, etc.    Updated Medication List Outpatient Encounter Prescriptions as of 08/25/2014  Medication Sig  . cetirizine (ZYRTEC) 10 MG tablet Take 10 mg by mouth daily.  . naproxen (NAPROSYN) 500 MG tablet Take 1 tablet (500 mg total) by mouth 3 (three) times daily with meals. (Patient taking differently: Take 500 mg by mouth 3 (three) times daily as needed. )  . [DISCONTINUED] oxyCODONE-acetaminophen (PERCOCET/ROXICET) 5-325 MG per tablet Take 1 tablet by mouth every 6 (six) hours as needed for severe pain. (Patient not taking: Reported on 08/25/2014)   No facility-administered encounter medications on file as of 08/25/2014.    Orders for this visit: No orders of the defined types were placed in this encounter.    Thank  you for the visitation and for allowing  [facility] to assist in the care of your patient. Our recommendations are noted above.  Please contact us if we can be of further service.  Stephanie Acre, MD [facility] Office Number: [phone]

## 2014-08-25 NOTE — Patient Instructions (Signed)
Follow up with Dr. Dema Severin in 3 months -No contact sports for 2 months. -No golfing, tenderness, swimming competitions or any sports that would put stress on your intercostal pulmonary muscles (muscles between your ribs) -Ibuprofen 800 mg: 1 tab 3 times a day for 3 days. May repeat if pain continues. Do not use for more than 7 days consecutively.

## 2015-01-03 ENCOUNTER — Encounter: Payer: Self-pay | Admitting: Internal Medicine

## 2015-01-03 ENCOUNTER — Ambulatory Visit (INDEPENDENT_AMBULATORY_CARE_PROVIDER_SITE_OTHER): Payer: BLUE CROSS/BLUE SHIELD | Admitting: Internal Medicine

## 2015-01-03 VITALS — BP 110/64 | HR 66 | Ht 72.0 in | Wt 137.2 lb

## 2015-01-03 DIAGNOSIS — Z23 Encounter for immunization: Secondary | ICD-10-CM | POA: Diagnosis not present

## 2015-01-03 DIAGNOSIS — R091 Pleurisy: Secondary | ICD-10-CM | POA: Diagnosis not present

## 2015-01-03 NOTE — Patient Instructions (Signed)
Follow up with Dr. Dema Severin  As needed - may start back contact sports slowly (soccer) - flu shot today - if chest discomfort starts back with contact sports, then call our office back for follow up and recommendations

## 2015-01-03 NOTE — Assessment & Plan Note (Signed)
Etiology of this left chest wall pain is most likely musculoskeletal in nature/pleurisy-this is now resolving since stopping contact sports and intermittent doses of high-dose ibuprofen.. He has avoided contact sports since his last visit, and has not had any recurrence of his chest pain/discomfort. At today's visit I have stated he may resume contact sports at a slower pace. If chest discomfort restarts with contact sports season advised to call our office for recommendations on ibuprofen and possible imaging studies.  Recent CT scan were no pneumothorax, there are some diffuse scattered pulmonary nodules that are most likely intrapulmonary nodules from postoperative changes, low risk for malignancy in this patient with this age.  Plan: -Patient may resume contact sports at a slow pace -Supportive care for chest discomfort/pleurisy. -Flu shot today

## 2015-01-03 NOTE — Progress Notes (Signed)
[facility] Pulmonary Medicine Consultation      MRN# [id number] Juan Foster 1980/11/24   CC: Chief Complaint  Patient presents with  . Follow-up    pleurisy and SOB. pt. states breathing is better. denies SOB, wheezing or chest pain/tightness. occ. dry cough.      Brief History: 08/09/14 HPI She is a pleasant 34 year old male presents today for evaluation of pleurisy and intermittent episodes of shortness of breath. Patient hasn't significant history of pneumothorax 5, 3 times on the right and 2 times on the left. 2005 he had bilateral pleurodesis and has been doing well until this year. He states that in the past when he had a pneumothorax he would have sudden onset of shortness of breath accompanied with chest discomfort/chest pain, which is described as a burning in his chest. When February of this year while at work sitting, he had a similar episode that lasted a few seconds. In the last month and a half he is very active and play soccer, doing well on his soccer matches he experiences similar type of pain especially after having a elbow to the chest. He follow-up with his primary care physician who got a chest x-ray which was essentially normal. He was also given Percocet which did help with the chest pain. Other than being tall and thin there is no organic etiology for his spontaneous pneumothoraces in the past. He was referred today for further evaluation of pleurisy and shortness of breath. About 4 weeks ago while playing soccer without any trauma or injury started to develop shortness of breath again, to the point where he had to stop right away. There is no family history of asthma, spontaneous pneumothorax, Ehalos-Dhanlos syndrome, Marfan's in the family that he could recall. He states that his grandfather had COPD when he died. Today he describes chest discomfort mostly in the left upper chest, with deep inspiration and palpation. He states that he smoked on and off for about  8 years, about 4-56 per day. He stopped smoking completely in 2012. Patient states that he he occasionally/socially uses E cigarettes maybe once a month when he is out with friends. Patient states that he believe he had talc pleurodesis. Plan - no contact sports, repeat ct chest  ROV 08/25/14: Patient presents today for follow-up visit, and to discuss results of CT scan. At his last visit he was complaining of left-sided chest wall pain, concern for pneumothorax since he has a history of spontaneous pneumothorax status post pleurodesis in the past. Repeat CT showed intrapulmonary nodules and postoperative changes, but no significant pneumothorax. He did play soccer for one day with no further issues, last week he did practice playing soccer, and suddenly had left-sided chest wall tenderness and shortness of breath, which she describes as "somebody putting a knife in me." He states that since then the pain has gradually subsided, but it is tender to touch, and worse with deep breathing.  Plan: -Ibuprofen 800 mg 1 tab 3 times a day for 3 days then stop. May repeat if pain continues, did not use continuously for more in 7 days. -Avoid contact sports, no tenderness, no golfing, no soccer, no basketball, no football, etc.  Events since last clinic visit: She presents today for a follow-up visit. At his last visit he was diagnosed with pleurisy from possibly contact sports. He was placed on scheduled high-dose ibuprofen, which resolved his discomfort completely. Since his last visit he has not played any contact sports, soccer, and does not  experience any chest discomfort. He denies shortness of breath, wheezing, chest discomfort, sinus drainage, cough. At today's visit he inquired about restarting contact sports, this was advised as being appropriate at a slower pace.     Medication:   Current Outpatient Rx  Name  Route  Sig  Dispense  Refill  . cetirizine (ZYRTEC) 10 MG tablet   Oral   Take  10 mg by mouth as needed.             Review of Systems  Constitutional: Negative for fever, chills and weight loss.  Eyes: Negative for blurred vision.  Respiratory: Negative for cough, hemoptysis, sputum production, shortness of breath and wheezing.   Cardiovascular: Negative for chest pain and palpitations.  Gastrointestinal: Negative for heartburn and nausea.  Genitourinary: Negative for dysuria.  Musculoskeletal: Negative for myalgias and joint pain.  Skin: Negative for itching and rash.  Neurological: Negative for tingling and headaches.  Endo/Heme/Allergies: Does not bruise/bleed easily.      Allergies:  Review of patient's allergies indicates no known allergies.  Physical Examination:  VS: BP 110/64 mmHg  Pulse 66  Ht 6' (1.829 m)  Wt 137 lb 3.2 oz (62.234 kg)  BMI 18.60 kg/m2  SpO2 96%  General Appearance: No distress  HEENT: PERRLA, no ptosis, no other lesions noticed Pulmonary:normal breath sounds., diaphragmatic excursion normal.No wheezing, No rales   Cardiovascular:  Normal S1,S2.  No m/r/g.     Abdomen:Exam: Benign, Soft, non-tender, No masses  Skin:   warm, no rashes, no ecchymosis  Extremities: normal, no cyanosis, clubbing, warm with normal capillary refill.      Assessment and Plan: Pleurisy Etiology of this left chest wall pain is most likely musculoskeletal in nature/pleurisy-this is now resolving since stopping contact sports and intermittent doses of high-dose ibuprofen.. He has avoided contact sports since his last visit, and has not had any recurrence of his chest pain/discomfort. At today's visit I have stated he may resume contact sports at a slower pace. If chest discomfort restarts with contact sports season advised to call our office for recommendations on ibuprofen and possible imaging studies.  Recent CT scan were no pneumothorax, there are some diffuse scattered pulmonary nodules that are most likely intrapulmonary nodules from  postoperative changes, low risk for malignancy in this patient with this age.  Plan: -Patient may resume contact sports at a slow pace -Supportive care for chest discomfort/pleurisy. -Flu shot today     Updated Medication List Outpatient Encounter Prescriptions as of 01/03/2015  Medication Sig  . cetirizine (ZYRTEC) 10 MG tablet Take 10 mg by mouth as needed.   . [DISCONTINUED] naproxen (NAPROSYN) 500 MG tablet Take 1 tablet (500 mg total) by mouth 3 (three) times daily with meals. (Patient not taking: Reported on 01/03/2015)   No facility-administered encounter medications on file as of 01/03/2015.    Orders for this visit: No orders of the defined types were placed in this encounter.    Thank  you for the visitation and for allowing  [facility] to assist in the care of your patient. Our recommendations are noted above.  Please contact us if we can be of further service.  Stephanie Acre, MD  Pulmonary and Critical Care Office Number: [phone]  Note: This note was prepared with Dragon dictation along with smaller phrase technology. Any transcriptional errors that result from this process are unintentional.

## 2015-01-03 NOTE — Addendum Note (Signed)
Addended by: Meyer Cory R on: 01/03/2015 10:05 AM   Modules accepted: Orders

## 2015-01-21 ENCOUNTER — Ambulatory Visit (INDEPENDENT_AMBULATORY_CARE_PROVIDER_SITE_OTHER): Payer: BLUE CROSS/BLUE SHIELD | Admitting: Family Medicine

## 2015-01-21 ENCOUNTER — Encounter: Payer: Self-pay | Admitting: Family Medicine

## 2015-01-21 VITALS — BP 106/64 | HR 61 | Temp 98.0°F | Ht 72.0 in | Wt 135.0 lb

## 2015-01-21 DIAGNOSIS — Z0000 Encounter for general adult medical examination without abnormal findings: Secondary | ICD-10-CM

## 2015-01-21 DIAGNOSIS — Z13220 Encounter for screening for lipoid disorders: Secondary | ICD-10-CM | POA: Diagnosis not present

## 2015-01-21 DIAGNOSIS — Z131 Encounter for screening for diabetes mellitus: Secondary | ICD-10-CM

## 2015-01-21 LAB — LIPID PANEL
Cholesterol: 163 mg/dL (ref 0–200)
HDL: 60.3 mg/dL (ref >39.00)
LDL CALC: 93 mg/dL (ref 0–99)
NonHDL: 102.24
TRIGLYCERIDES: 47 mg/dL (ref 0.0–149.0)
Total CHOL/HDL Ratio: 3
VLDL: 9.4 mg/dL (ref 0.0–40.0)

## 2015-01-21 LAB — GLUCOSE, RANDOM: Glucose, Bld: 85 mg/dL (ref 70–99)

## 2015-01-21 NOTE — Patient Instructions (Addendum)
Go to the lab on the way out.  We'll contact you with your lab report. Take care.  Glad to see you.  

## 2015-01-21 NOTE — Progress Notes (Signed)
Pre visit review using our clinic review tool, if applicable. No additional management support is needed unless otherwise documented below in the visit note.  CPE- See plan.  Routine anticipatory guidance given to patient.  See health maintenance. Tetanus 2012 Flu 2016 PNA and shingles not due.   HIV testing prev done.  Colon and prostate cancer screening not due, d/w pt.  Living will d/w pt.  Would have his wife designated if he were incapacitated.  Diet and exercise d/w pt. He has prev chest wall pain/pleurisy.  He is trying to work back into exercise.   Diet is good.  Due for labs.  Fasting.    Skin lesion on medial L thigh.  Started about 4-5 years ago.  Wanted eval.   No acute changes.   PMH and SH reviewed  Meds, vitals, and allergies reviewed.   ROS: See HPI.  Otherwise negative.    GEN: nad, alert and oriented HEENT: mucous membranes moist NECK: supple w/o LA CV: rrr. PULM: ctab, no inc wob, old scarring noted on chest wall.  ABD: soft, +bs EXT: no edema SKIN: no acute rash. 59mm faintly pigmented macule with tougher scar tissue deep to the lesion.  This looks like postinflammatory hyperpigmentation, not a true nevus.

## 2015-01-24 NOTE — Assessment & Plan Note (Signed)
Tetanus 2012  Flu 2016  PNA and shingles not due.  HIV testing prev done.  Colon and prostate cancer screening not due, d/w pt.  Living will d/w pt. Would have his wife designated if he were incapacitated.  Diet and exercise d/w pt. He has prev chest wall pain/pleurisy. He is trying to work back into exercise. Diet is good.  Due for labs. Fasting.  See notes on labs.  Reassured re: skin lesion.  Can update me as needed, if changing.  Appears benign.

## 2015-01-25 ENCOUNTER — Encounter: Payer: Self-pay | Admitting: *Deleted

## 2016-01-23 ENCOUNTER — Ambulatory Visit (INDEPENDENT_AMBULATORY_CARE_PROVIDER_SITE_OTHER): Payer: 59 | Admitting: Family Medicine

## 2016-01-23 ENCOUNTER — Encounter: Payer: Self-pay | Admitting: Family Medicine

## 2016-01-23 VITALS — BP 98/72 | HR 57 | Temp 98.6°F | Ht 72.0 in | Wt 141.5 lb

## 2016-01-23 DIAGNOSIS — Z131 Encounter for screening for diabetes mellitus: Secondary | ICD-10-CM | POA: Diagnosis not present

## 2016-01-23 DIAGNOSIS — Z23 Encounter for immunization: Secondary | ICD-10-CM | POA: Diagnosis not present

## 2016-01-23 DIAGNOSIS — Z0000 Encounter for general adult medical examination without abnormal findings: Secondary | ICD-10-CM | POA: Diagnosis not present

## 2016-01-23 DIAGNOSIS — Z13220 Encounter for screening for lipoid disorders: Secondary | ICD-10-CM

## 2016-01-23 DIAGNOSIS — R109 Unspecified abdominal pain: Secondary | ICD-10-CM

## 2016-01-23 LAB — LIPID PANEL
CHOLESTEROL: 164 mg/dL (ref 0–200)
HDL: 59.1 mg/dL (ref >39.00)
LDL Cholesterol: 93 mg/dL (ref 0–99)
NonHDL: 104.78
TRIGLYCERIDES: 57 mg/dL (ref 0.0–149.0)
Total CHOL/HDL Ratio: 3
VLDL: 11.4 mg/dL (ref 0.0–40.0)

## 2016-01-23 LAB — GLUCOSE, RANDOM: Glucose, Bld: 88 mg/dL (ref 70–99)

## 2016-01-23 MED ORDER — AMOXICILLIN-POT CLAVULANATE 875-125 MG PO TABS: 1.0000 | ORAL_TABLET | Freq: Two times a day (BID) | ORAL | 0 refills | Status: DC

## 2016-01-23 MED ORDER — ALBUTEROL SULFATE (2.5 MG/3ML) 0.083% IN NEBU: 2.5000 mg | INHALATION_SOLUTION | Freq: Four times a day (QID) | RESPIRATORY_TRACT | 1 refills | Status: DC | PRN

## 2016-01-23 NOTE — Assessment & Plan Note (Addendum)
Tetanus 2012 Flu 2017 PNA and shingles not due.   HIV testing prev done.  Colon and prostate cancer screening not due, d/w pt.  Living will d/w pt.  Would have his wife designated if he were incapacitated.  Diet and exercise d/w pt.  Diet is fair.  D/w pt about exercise- was swimming more in the summer.   Due for labs.  Fasting.   He has h/o low BP at baseline, not lightheaded.   Skin lesion is unchanged from previous. Observation is reasonable. Discussed with patient

## 2016-01-23 NOTE — Progress Notes (Signed)
CPE- See plan.  Routine anticipatory guidance given to patient.  See health maintenance.  Tetanus 2012 Flu 2017 PNA and shingles not due.   HIV testing prev done.  Colon and prostate cancer screening not due, d/w pt.  Living will d/w pt.  Would have his wife designated if he were incapacitated.  Diet and exercise d/w pt.  Diet is fair.  D/w pt about exercise- was swimming more in the summer.   Due for labs.  Fasting.   He has h/o low BP at baseline, not lightheaded.    Chronic intermittent mid abd pain pain, lasts for a few hours at a time.  Every other day for the last few months.  No blood in stool.  H/o similar about a 18 years ago.   No diarrhea.  No vomiting.  No black stools.  No diet changes recently.  No weight loss.  No palpable lump mass or bulge.    He and his wife are going through infertility eval/tx.   Skin lesion on medial L thigh.  Started about 5-6 years ago.  Wanted eval.   No acute changes.   PMH and SH reviewed  Meds, vitals, and allergies reviewed.   ROS: Per HPI.  Unless specifically indicated otherwise in HPI, the patient denies:  General: fever. Eyes: acute vision changes ENT: sore throat Cardiovascular: chest pain Respiratory: SOB GI: vomiting GU: dysuria Musculoskeletal: acute back pain Derm: acute rash Neuro: acute motor dysfunction Psych: worsening mood Endocrine: polydipsia Heme: bleeding Allergy: hayfever  GEN: nad, alert and oriented HEENT: mucous membranes moist NECK: supple w/o LA CV: rrr. PULM: ctab, no inc wob ABD: soft, +bs, not ttp, no RUQ or RLQ pain.  EXT: no edema SKIN: no acute rash, 66mm faintly pigmented macule with tougher scar tissue deep to the lesion.  This looks like postinflammatory hyperpigmentation, not a true nevus.

## 2016-01-23 NOTE — Patient Instructions (Signed)
We'll contact you with your lab report. Take care.  Glad to see you.  Check to see if cutting out fatty foods affects the right sided abdominal pain.  Update me if better/worse/the same.

## 2016-01-23 NOTE — Assessment & Plan Note (Signed)
He does not have pain at time of exam. He doesn't have true right lower quadrant pain. I question if this originates in the right upper quadrant, but is felt slightly lower in the abdomen by the patient. Benign exam at this point. At this point lab workup would likely be unremarkable. Reasonable to have patient monitor his diet and see if he notices return of symptoms with fatty foods or absence of symptoms with low-fat diet. He agrees. Discussed with him about possible gallbladder pathology in general. At this point okay for outpatient follow-up. He will update me.

## 2016-01-23 NOTE — Progress Notes (Signed)
Pre visit review using our clinic review tool, if applicable. No additional management support is needed unless otherwise documented below in the visit note. 

## 2016-01-24 ENCOUNTER — Encounter: Payer: Self-pay | Admitting: *Deleted

## 2016-09-26 ENCOUNTER — Encounter: Payer: Self-pay | Admitting: Family Medicine

## 2016-09-26 ENCOUNTER — Ambulatory Visit (INDEPENDENT_AMBULATORY_CARE_PROVIDER_SITE_OTHER): Payer: 59 | Admitting: Family Medicine

## 2016-09-26 VITALS — BP 100/60 | HR 62 | Temp 98.4°F | Ht 71.5 in | Wt 141.0 lb

## 2016-09-26 DIAGNOSIS — Z131 Encounter for screening for diabetes mellitus: Secondary | ICD-10-CM | POA: Diagnosis not present

## 2016-09-26 DIAGNOSIS — Z13220 Encounter for screening for lipoid disorders: Secondary | ICD-10-CM

## 2016-09-26 DIAGNOSIS — Z0000 Encounter for general adult medical examination without abnormal findings: Secondary | ICD-10-CM

## 2016-09-26 DIAGNOSIS — L219 Seborrheic dermatitis, unspecified: Secondary | ICD-10-CM

## 2016-09-26 DIAGNOSIS — Z7189 Other specified counseling: Secondary | ICD-10-CM

## 2016-09-26 NOTE — Progress Notes (Signed)
CPE- See plan.  Routine anticipatory guidance given to patient.  See health maintenance.  The possibility exists that previously documented standard health maintenance information may have been brought forward from a previous encounter into this note.  If needed, that same information has been updated to reflect the current situation based on today's encounter.    Tetanus 2012 Flu 2017 PNA and shingles not due.  HIV testing prev done.  Colon and prostate cancer screening not due, d/w pt.  Living will d/w pt. Would have his wife designated if he were incapacitated.  Diet and exercise d/w pt.encouraged both.  Has been swimming.   Due for labs. Fasting.  He has h/o low BP at baseline, not lightheaded.   He has a lot of skin peeling from the face- along the scalp line and in his beard line.  His father has similar.  He tried OTC creams.  Head and shoulders shampoo didn't help.    Foster application paperwork done.  D/w pt. See scanned forms.  There is no reason medical or otherwise that would disqualify him from serving with foster care, etc.    PMH and SH reviewed  Meds, vitals, and allergies reviewed.   ROS: Per HPI.  Unless specifically indicated otherwise in HPI, the patient denies:  General: fever. Eyes: acute vision changes ENT: sore throat Cardiovascular: chest pain Respiratory: SOB GI: vomiting GU: dysuria Musculoskeletal: acute back pain Derm: acute rash Neuro: acute motor dysfunction Psych: worsening mood Endocrine: polydipsia Heme: bleeding Allergy: hayfever  GEN: nad, alert and oriented HEENT: mucous membranes moist NECK: supple w/o LA CV: rrr. PULM: ctab, no inc wob ABD: soft, +bs EXT: no edema SKIN: no acute rash but mild facial seborrhea noted.

## 2016-09-26 NOTE — Patient Instructions (Signed)
Go to the lab on the way out.  We'll contact you with your lab report. Take care.  Glad to see you.  Try topical ketoconazole cream on the irritated areas on your face- likely facial seborrhea.  Update me as needed.

## 2016-09-27 DIAGNOSIS — L219 Seborrheic dermatitis, unspecified: Secondary | ICD-10-CM | POA: Insufficient documentation

## 2016-09-27 DIAGNOSIS — Z7189 Other specified counseling: Secondary | ICD-10-CM | POA: Insufficient documentation

## 2016-09-27 LAB — LIPID PANEL
CHOL/HDL RATIO: 3
Cholesterol: 157 mg/dL (ref 0–200)
HDL: 56.2 mg/dL (ref >39.00)
LDL Cholesterol: 85 mg/dL (ref 0–99)
NONHDL: 101.15
Triglycerides: 82 mg/dL (ref 0.0–149.0)
VLDL: 16.4 mg/dL (ref 0.0–40.0)

## 2016-09-27 LAB — GLUCOSE, RANDOM: GLUCOSE: 82 mg/dL (ref 70–99)

## 2016-09-27 NOTE — Assessment & Plan Note (Signed)
Discussed with patient. Reasonable to try over-the-counter antifungal such as ketoconazole cream. Update me as needed. He agrees.

## 2016-09-27 NOTE — Assessment & Plan Note (Signed)
Tetanus 2012 Flu 2017 PNA and shingles not due.  HIV testing prev done.  Colon and prostate cancer screening not due, d/w pt.  Living will d/w pt. Would have his wife designated if he were incapacitated.  Diet and exercise d/w pt.encouraged both.  Has been swimming.   Due for labs. Fasting.  He has h/o low BP at baseline, not lightheaded.

## 2016-09-27 NOTE — Assessment & Plan Note (Signed)
Living will d/w pt. Would have his wife designated if he were incapacitated.  

## 2016-10-16 ENCOUNTER — Encounter: Payer: Self-pay | Admitting: Family Medicine

## 2016-10-16 ENCOUNTER — Ambulatory Visit (INDEPENDENT_AMBULATORY_CARE_PROVIDER_SITE_OTHER): Payer: 59 | Admitting: Family Medicine

## 2016-10-16 VITALS — BP 110/70 | HR 65 | Temp 97.8°F | Ht 71.5 in | Wt 139.4 lb

## 2016-10-16 DIAGNOSIS — N342 Other urethritis: Secondary | ICD-10-CM

## 2016-10-16 DIAGNOSIS — R350 Frequency of micturition: Secondary | ICD-10-CM

## 2016-10-16 LAB — POCT URINALYSIS DIPSTICK
BILIRUBIN UA: NEGATIVE
Glucose, UA: NEGATIVE
Ketones, UA: NEGATIVE
Leukocytes, UA: NEGATIVE
Nitrite, UA: NEGATIVE
Protein, UA: NEGATIVE
RBC UA: NEGATIVE
SPEC GRAV UA: 1.015 (ref 1.010–1.025)
Urobilinogen, UA: 0.2 E.U./dL
pH, UA: 7 (ref 5.0–8.0)

## 2016-10-16 MED ORDER — AZITHROMYCIN 250 MG PO TABS: ORAL_TABLET | ORAL | 0 refills | Status: DC

## 2016-10-16 NOTE — Patient Instructions (Signed)
Push water. Complete antibiotics.  if not improving as expected follow up for further testing.

## 2016-10-16 NOTE — Progress Notes (Signed)
   Subjective:    Patient ID: Juan Foster, male    DOB: 08-Dec-1980, 36 y.o.   MRN: [id number]  HPI   36 year old male presents with new onset pain in right lower back in last 2 days, as well as decreased urinary steam. Stinging sensation when urinating very mild yesterday, small amount of milky penile discharge noted.. Now resolved. Feels like not emptying completely. Also noted increase in urinary frequency, no urgency. No blood in urine.  No new abdominal pain... Does have some chronic hx for 20 years of pain in right flank No fever. No perineal pain.  Always has slightly loose stool.. Not new.  No history of kidney stones known.  No past UTI.   Sexually active, one partner.  No STDs in past.  Urine dipstick shows negative for all components.  Micro exam: not done.   Social History /Family History/Past Medical History reviewed in detail and updated in EMR if needed. Blood pressure 110/70, pulse 65, temperature 97.8 F (36.6 C), temperature source Oral, height 5' 11.5" (1.816 m), weight 139 lb 6.4 oz (63.2 kg), SpO2 98 %.   Review of Systems  Constitutional: Negative for fatigue and fever.  HENT: Negative for ear pain.   Eyes: Negative for pain.  Respiratory: Negative for cough and shortness of breath.   Cardiovascular: Negative for chest pain, palpitations and leg swelling.  Gastrointestinal: Negative for abdominal pain.  Genitourinary: Negative for dysuria.  Musculoskeletal: Negative for arthralgias.  Neurological: Negative for syncope, light-headedness and headaches.  Psychiatric/Behavioral: Negative for dysphoric mood.       Objective:   Physical Exam  Constitutional: Vital signs are normal. He appears well-developed and well-nourished.  HENT:  Head: Normocephalic.  Right Ear: Hearing normal.  Left Ear: Hearing normal.  Nose: Nose normal.  Mouth/Throat: Oropharynx is clear and moist and mucous membranes are normal.  Neck: Trachea normal. Carotid bruit is  not present. No thyroid mass and no thyromegaly present.  Cardiovascular: Normal rate, regular rhythm and normal pulses.  Exam reveals no gallop, no distant heart sounds and no friction rub.   No murmur heard. No peripheral edema  Pulmonary/Chest: Effort normal and breath sounds normal. No respiratory distress.  Abdominal: There is no hepatosplenomegaly. There is tenderness in the right upper quadrant. There is no rebound, no guarding and no CVA tenderness.  Skin: Skin is warm, dry and intact. No rash noted.  Psychiatric: He has a normal mood and affect. His speech is normal and behavior is normal. Thought content normal.          Assessment & Plan:

## 2016-10-16 NOTE — Assessment & Plan Note (Signed)
Neg UA, no clear sign on exam of acute prostatitis. Given urethra milky discharge.. Bacterial urethritis is most likely cause.  Pt denies exposure to  STDs, states he is low risk given monogomous relationship with wife. He refuses testing for GC/chlam which  I feel is reasonable as long as his symptoms resolve with treatment.  We will treat him with course of azithro.

## 2016-12-05 ENCOUNTER — Encounter: Payer: Self-pay | Admitting: Family Medicine

## 2016-12-05 ENCOUNTER — Ambulatory Visit (INDEPENDENT_AMBULATORY_CARE_PROVIDER_SITE_OTHER): Payer: 59 | Admitting: Family Medicine

## 2016-12-05 VITALS — BP 120/76 | HR 50 | Temp 98.1°F | Ht 71.5 in | Wt 141.0 lb

## 2016-12-05 DIAGNOSIS — R300 Dysuria: Secondary | ICD-10-CM

## 2016-12-05 DIAGNOSIS — R339 Retention of urine, unspecified: Secondary | ICD-10-CM | POA: Diagnosis not present

## 2016-12-05 LAB — POC URINALSYSI DIPSTICK (AUTOMATED)
BILIRUBIN UA: NEGATIVE
GLUCOSE UA: NEGATIVE
KETONES UA: NEGATIVE
LEUKOCYTES UA: NEGATIVE
Nitrite, UA: NEGATIVE
Urobilinogen, UA: 1 E.U./dL
pH, UA: 6 (ref 5.0–8.0)

## 2016-12-05 MED ORDER — CIPROFLOXACIN HCL 500 MG PO TABS: 500.0000 mg | ORAL_TABLET | Freq: Two times a day (BID) | ORAL | 0 refills | Status: DC

## 2016-12-05 NOTE — Progress Notes (Signed)
Subjective:    Patient ID: Juan Foster, male    DOB: 01/21/1981, 36 y.o.   MRN: [id number]  HPI  36 yo pt of Dr Juan Foster here with abdominal and back pain - concerned about poss uti    Hx of chronic flank pain  H/o urethritis in august-was treated by Dr Juan Foster with azithromycin  Did not do gc/chl screening due to monogamous status  His UA was neg at the time   It cleared everything up for 1 1/2 weeks and symptoms returned   Having pain in low back and in pelvic area  Tingles on tip of penis  General discomfort to urinate  No obvious penile d/c but thought he had odor at one time  Some hesitancy with urination - feels like he is not emptying all the way   Monogamous  No ext to stds Trying to get pregnant   Results for orders placed or performed in visit on 12/05/16  POCT Urinalysis Dipstick (Automated)  Result Value Ref Range   Color, UA Yellow    Clarity, UA Clear    Glucose, UA Negative    Bilirubin, UA Negative    Ketones, UA Negative    Spec Grav, UA >=1.030 (A) 1.010 - 1.025   Blood, UA Large    pH, UA 6.0 5.0 - 8.0   Protein, UA Trace    Urobilinogen, UA 1.0 0.2 or 1.0 E.U./dL   Nitrite, UA Negative    Leukocytes, UA Negative Negative      Patient Active Problem List   Diagnosis Date Noted  . Dysuria 12/05/2016  . Urethritis 10/16/2016  . Advance care planning 09/27/2016  . Seborrhea 09/27/2016  . Routine general medical examination at a health care facility 07/10/2010   Past Medical History:  Diagnosis Date  . History of chicken pox   . 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 Surgery to fuse lungs to cavity wall to prevent collapse  2006   Social History  Substance Use Topics  . Smoking status: Former Smoker    Quit date: 02/13/2011  . Smokeless tobacco: Never Used  . Alcohol use 0.0 oz/week     Comment: 1 bottle of wine/week   Family History  Problem Relation  Age of Onset  . COPD Paternal Grandfather   . Sudden death Paternal Grandfather   . Prostate cancer Neg Hx   . Colon cancer Neg Hx    No Known Allergies Current Outpatient Prescriptions on File Prior to Visit  Medication Sig Dispense Refill  . IBUPROFEN PO Take by mouth.     No current facility-administered medications on file prior to visit.      Review of Systems  Constitutional: Negative for activity change, appetite change, fatigue, fever and unexpected weight change.  HENT: Negative for congestion, rhinorrhea, sore throat and trouble swallowing.   Eyes: Negative for pain, redness, itching and visual disturbance.  Respiratory: Negative for cough, chest tightness, shortness of breath and wheezing.   Cardiovascular: Negative for chest pain and palpitations.  Gastrointestinal: Negative for abdominal pain, blood in stool, constipation, diarrhea and nausea.  Endocrine: Negative for cold intolerance, heat intolerance, polydipsia and polyuria.  Genitourinary: Positive for dysuria and flank pain. Negative for decreased urine volume, difficulty urinating, enuresis, frequency, genital sores, hematuria, scrotal swelling, testicular pain and urgency.       Flank pain is old  and chronic/unrelated to current symptoms   Musculoskeletal: Negative for arthralgias, joint swelling and myalgias.  Skin: Negative for pallor and rash.  Neurological: Negative for dizziness, tremors, weakness, numbness and headaches.  Hematological: Negative for adenopathy. Does not bruise/bleed easily.  Psychiatric/Behavioral: Negative for decreased concentration and dysphoric mood. The patient is not nervous/anxious.        Objective:   Physical Exam  Constitutional: He appears well-developed and well-nourished. No distress.  Slim and well appearing  Eyes: Pupils are equal, round, and reactive to light. Conjunctivae and EOM are normal. Right eye exhibits no discharge. Left eye exhibits no discharge.  Neck: Normal  range of motion. Neck supple.  Cardiovascular: Normal rate and regular rhythm.   Pulmonary/Chest: Effort normal and breath sounds normal. He has no wheezes. He has no rales.  Abdominal: Soft. Bowel sounds are normal. He exhibits no distension. There is tenderness. There is no rebound and no guarding.  Mild tenderness suprapubic R cva tenderness is baseline per pt No L cva tendernes  Genitourinary: Penis normal. Rectal exam shows external hemorrhoid. Rectal exam shows no fissure, no mass and no tenderness. Prostate is tender. Prostate is not enlarged. No penile erythema or penile tenderness. No discharge found.  Genitourinary Comments: Prostate is unenlarged, symmetric, boggy in texture and mildly tender   Musculoskeletal: He exhibits no edema.  Lymphadenopathy:    He has no cervical adenopathy.  Skin: Skin is warm and dry. No rash noted. No erythema.  Psychiatric: He has a normal mood and affect.          Assessment & Plan:   Problem List Items Addressed This Visit      Other   Dysuria    Reviewed hx (brief imp with azithro) and no exp to STD Suspect prostatitis (vs uti)  ua with blood only-sent for cx  tx with cipro 500 bid for 14 d -may extend to 28 d if necessary  Pending cx for plan  Update if no improvement        Relevant Orders   Urine Culture    Other Visit Diagnoses    Urinary retention    -  Primary   Relevant Orders   POCT Urinalysis Dipstick (Automated) (Completed)

## 2016-12-05 NOTE — Patient Instructions (Signed)
I think you may have prostatitis ( a prostate infection)  We will send your urine for a culture to see if there may be a bladder infection as well   Push the water as much as you can  Take cipro as directed  We will start with a 2 week course and see how you are doing and how your culture looks before the final plan

## 2016-12-06 LAB — URINE CULTURE
MICRO NUMBER:: 81098514
RESULT: NO GROWTH
SPECIMEN QUALITY:: ADEQUATE

## 2016-12-06 NOTE — Assessment & Plan Note (Signed)
Reviewed hx (brief imp with azithro) and no exp to STD Suspect prostatitis (vs uti)  ua with blood only-sent for cx  tx with cipro 500 bid for 14 d -may extend to 28 d if necessary  Pending cx for plan  Update if no improvement

## 2017-03-07 ENCOUNTER — Encounter: Payer: Self-pay | Admitting: Family Medicine

## 2017-03-07 ENCOUNTER — Ambulatory Visit: Payer: 59 | Admitting: Family Medicine

## 2017-03-07 DIAGNOSIS — J069 Acute upper respiratory infection, unspecified: Secondary | ICD-10-CM | POA: Diagnosis not present

## 2017-03-07 MED ORDER — BENZONATATE 200 MG PO CAPS: 200.0000 mg | ORAL_CAPSULE | Freq: Three times a day (TID) | ORAL | 0 refills | Status: DC | PRN

## 2017-03-07 MED ORDER — AMOXICILLIN-POT CLAVULANATE 875-125 MG PO TABS: 1.0000 | ORAL_TABLET | Freq: Two times a day (BID) | ORAL | 0 refills | Status: DC

## 2017-03-07 NOTE — Progress Notes (Signed)
Sick since christmas, [name], coughing, sneezing (bold sx most bothersome), rhinorrhea, congestion.  No fevers.  No vomiting.  No diarrhea.  No rash.  No facial pain.  No ear pain.  Benadryl didn't help much.  nyquil helped some.  He had HA with flonase.  He hasn't noted wheeze.    He has 52.38 year old foster child coming to his house today.  I thanked him for his effort, discussed.    Meds, vitals, and allergies reviewed.   ROS: Per HPI unless specifically indicated in ROS section   GEN: nad, alert and oriented HEENT: mucous membranes moist, tm w/o erythema, nasal exam w/o erythema, clear discharge noted,  OP with cobblestoning NECK: supple w/o LA CV: rrr.   PULM: ctab, no inc wob EXT: no edema

## 2017-03-07 NOTE — Patient Instructions (Signed)
Try OTC nasal saline for nasal congestion.  Use tessalon for the cough.   Try claritin for sneezing.  10mg  a day.   Rest and fluids, as much as you can.  Update me if not better soon.   Hold augmentin for now.

## 2017-03-08 DIAGNOSIS — J069 Acute upper respiratory infection, unspecified: Secondary | ICD-10-CM | POA: Insufficient documentation

## 2017-03-08 NOTE — Assessment & Plan Note (Signed)
Likely viral, hold abx for now.  Try OTC nasal saline for nasal congestion.  Use tessalon for the cough.   Try claritin for sneezing.  10mg  a day.   Rest and fluids, as much as you can.  Update me if not better soon.   Start augmentin if worse in the next few days.  He agrees.  Nontoxic.

## 2018-10-20 ENCOUNTER — Ambulatory Visit: Payer: 59 | Admitting: Family Medicine

## 2018-10-23 ENCOUNTER — Ambulatory Visit: Payer: 59 | Admitting: Family Medicine

## 2018-10-31 ENCOUNTER — Other Ambulatory Visit: Payer: Self-pay

## 2018-10-31 ENCOUNTER — Encounter: Payer: Self-pay | Admitting: Family Medicine

## 2018-10-31 ENCOUNTER — Ambulatory Visit (INDEPENDENT_AMBULATORY_CARE_PROVIDER_SITE_OTHER): Payer: 59 | Admitting: Family Medicine

## 2018-10-31 VITALS — BP 92/56 | HR 56 | Temp 97.7°F | Ht 71.5 in | Wt 135.0 lb

## 2018-10-31 DIAGNOSIS — Z0000 Encounter for general adult medical examination without abnormal findings: Secondary | ICD-10-CM

## 2018-10-31 DIAGNOSIS — Z7189 Other specified counseling: Secondary | ICD-10-CM

## 2018-10-31 DIAGNOSIS — R519 Headache, unspecified: Secondary | ICD-10-CM

## 2018-10-31 DIAGNOSIS — Z23 Encounter for immunization: Secondary | ICD-10-CM

## 2018-10-31 MED ORDER — LORATADINE 10 MG PO TABS: 10.0000 mg | ORAL_TABLET | Freq: Every day | ORAL | Status: DC

## 2018-10-31 NOTE — Progress Notes (Signed)
CPE- See plan.  Routine anticipatory guidance given to patient.  See health maintenance.  The possibility exists that previously documented standard health maintenance information may have been brought forward from a previous encounter into this note.  If needed, that same information has been updated to reflect the current situation based on today's encounter.    Tetanus 2012 Flu 2017 PNA and shingles not due.  HIV testing prev done.  Colon and prostate cancer screening not due, d/w pt.  Living will d/w pt. Would have his wife designated if he were incapacitated.  Diet and exercise d/w pt.Encouraged both.   He isn't lightheaded, he has h/o lower BP at baseline.   His foster son Dellie Catholic is in daycare now and that is helping some with time allotment.  His wife had a miscarriage, d/w pt.  He and his wife are going to go in for eval at fertility clinic.  He can send me labs from that.    Form for foster care done.  See scanned form.  He has had headaches frequently for months.  Ibuprofen 400mg  prn helps.  Noted midday and also at night.  Frontal HA.  He hasn't had eye exam yet, d/w pt about getting that done.  No vision changes.  He prev wore glasses but "graduated" from them prev.  No FCNAVD.  Sharp pain.  Not pulsating.  Can be constant when present.  He has some days that he doesn't notice the HA.  No rhinorrhea except for a little in the AM and not every day.  No ST.  H/o migraine as a teenager but not since.  No nausea.  No photophobia.  Most recent sx didn't feel like prev migraine.  He has h/o seasonal allergies.    PMH and SH reviewed  Meds, vitals, and allergies reviewed.   ROS: Per HPI.  Unless specifically indicated otherwise in HPI, the patient denies:  General: fever. Eyes: acute vision changes ENT: sore throat Cardiovascular: chest pain Respiratory: SOB GI: vomiting GU: dysuria Musculoskeletal: acute back pain Derm: acute rash Neuro: acute motor dysfunction Psych:  worsening mood Endocrine: polydipsia Heme: bleeding Allergy: hayfever  GEN: nad, alert and oriented HEENT: mucous membranes moist, TM wnl Frontal sinuses not tender to palpation. He does have L frontal pain with tapping L max area.   Nasal exam stuffy.   Maxillary sinuses not tender to palpation. NECK: supple w/o LA CV: rrr. PULM: ctab, no inc wob ABD: soft, +bs EXT: no edema SKIN: no acute rash CN 2-12 wnl B, S/S/DTR wnl x4

## 2018-10-31 NOTE — Patient Instructions (Addendum)
Please send me a copy of your labs when done.  Try taking claritin 10mg  a day and let me know if that doesn't help with the headaches.  Call about an eye clinic appointment.  Flu shot today.  Take care.  Glad to see you.

## 2018-11-02 DIAGNOSIS — R519 Headache, unspecified: Secondary | ICD-10-CM | POA: Insufficient documentation

## 2018-11-02 NOTE — Assessment & Plan Note (Signed)
Living will d/w pt. Would have his wife designated if he were incapacitated.  

## 2018-11-02 NOTE — Assessment & Plan Note (Signed)
Tetanus 2012 Flu 2017 PNA and shingles not due.  HIV testing prev done.  Colon and prostate cancer screening not due, d/w pt.  Living will d/w pt. Would have his wife designated if he were incapacitated.  Diet and exercise d/w pt.Encouraged both.

## 2018-11-02 NOTE — Assessment & Plan Note (Signed)
He has a normal neurologic exam which is reassuring but he does have left frontal pain with tapping the left maxillary area.  The frontal sinuses and maxillary sinuses not tender to palpation otherwise.  Discussed options. No emergent symptoms.  Reasonable to get eye exam done.  Reasonable to try taking Claritin 10 mg a day if any of this is related to nasal congestion/seasonal allergies.  He can update me if not better.  He agrees with plan.  Okay for outpatient follow-up.

## 2018-11-19 ENCOUNTER — Other Ambulatory Visit: Payer: Self-pay

## 2018-11-19 DIAGNOSIS — Z20822 Contact with and (suspected) exposure to covid-19: Secondary | ICD-10-CM

## 2018-11-20 LAB — NOVEL CORONAVIRUS, NAA: SARS-CoV-2, NAA: NOT DETECTED

## 2019-01-01 ENCOUNTER — Other Ambulatory Visit: Payer: Self-pay

## 2019-01-01 DIAGNOSIS — Z20822 Contact with and (suspected) exposure to covid-19: Secondary | ICD-10-CM

## 2019-01-02 LAB — NOVEL CORONAVIRUS, NAA: SARS-CoV-2, NAA: NOT DETECTED

## 2019-02-16 ENCOUNTER — Encounter: Payer: Self-pay | Admitting: Family Medicine

## 2019-02-16 ENCOUNTER — Other Ambulatory Visit: Payer: Self-pay

## 2019-02-16 ENCOUNTER — Ambulatory Visit: Payer: 59 | Admitting: Family Medicine

## 2019-02-16 DIAGNOSIS — M5430 Sciatica, unspecified side: Secondary | ICD-10-CM | POA: Diagnosis not present

## 2019-02-16 MED ORDER — PREDNISONE 20 MG PO TABS: ORAL_TABLET | ORAL | 0 refills | Status: DC

## 2019-02-16 NOTE — Patient Instructions (Signed)
Likely sciatica.  Prednisone with food.  Update me as needed.  Use the back exercises.  Take care.  Glad to see you.

## 2019-02-16 NOTE — Progress Notes (Signed)
This visit occurred during the SARS-CoV-2 public health emergency.  Safety protocols were in place, including screening questions prior to the visit, additional usage of staff PPE, and extensive cleaning of exam room while observing appropriate contact time as indicated for disinfecting solutions.  Thigh pain.  Started with sx about 3 weeks ago, R proximal lateral thigh.  Now anterior/lateral quad/thigh.  If he presses on his proximal R calf, his R lateral thigh hurts.  Ibuprofen/tylenol/heat doesn't help.  No L sided sx.  Pressing the gas pedal causes pain up the R leg.  No FCNAVD.  No numbness except when he used a heating pad.  No B/B sx.  No trigger, no trauma.  No back pain.  No rash.    Meds, vitals, and allergies reviewed.   ROS: Per HPI unless specifically indicated in ROS section   nad ncat rrr abd soft Midline back not ttp R lower back ttp Paresthesia R thigh, dermatomal.  R SLR positive.  DLT wnl.  Strength wnl ext x4.  No rash Skin well perfused.

## 2019-02-18 DIAGNOSIS — M5430 Sciatica, unspecified side: Secondary | ICD-10-CM | POA: Insufficient documentation

## 2019-02-18 NOTE — Assessment & Plan Note (Signed)
Likely sciatica.  Prednisone with food.  Update me as needed.  Handout given to patient about anatomy and lower back exercises, explained, discussed. He will try home exercises and update me as needed.  He agrees.  No weakness.  Okay for outpatient follow-up.  Steroid cautions discussed with patient.

## 2019-04-02 ENCOUNTER — Other Ambulatory Visit: Payer: Self-pay

## 2019-04-02 ENCOUNTER — Ambulatory Visit (INDEPENDENT_AMBULATORY_CARE_PROVIDER_SITE_OTHER)
Admission: RE | Admit: 2019-04-02 | Discharge: 2019-04-02 | Disposition: A | Discharge status: Home / Self Care | Payer: 59 | Source: Ambulatory Visit | Attending: Family Medicine | Admitting: Family Medicine

## 2019-04-02 ENCOUNTER — Ambulatory Visit: Payer: 59 | Admitting: Family Medicine

## 2019-04-02 ENCOUNTER — Encounter: Payer: Self-pay | Admitting: Family Medicine

## 2019-04-02 VITALS — BP 108/72 | HR 66 | Temp 96.7°F | Ht 71.5 in | Wt 139.6 lb

## 2019-04-02 DIAGNOSIS — M5430 Sciatica, unspecified side: Secondary | ICD-10-CM | POA: Diagnosis not present

## 2019-04-02 DIAGNOSIS — R29898 Other symptoms and signs involving the musculoskeletal system: Secondary | ICD-10-CM | POA: Diagnosis not present

## 2019-04-02 MED ORDER — GABAPENTIN 100 MG PO CAPS: 100.0000 mg | ORAL_CAPSULE | Freq: Three times a day (TID) | ORAL | 1 refills | Status: DC

## 2019-04-02 NOTE — Progress Notes (Signed)
This visit occurred during the SARS-CoV-2 public health emergency.  Safety protocols were in place, including screening questions prior to the visit, additional usage of staff PPE, and extensive cleaning of exam room while observing appropriate contact time as indicated for disinfecting solutions.  Still with R sided leg pain w/o relief from prednisone.  No ADE on prednisone other than feeling a little jittery.  No L sided sx.  Progressive pain.  When he has a flare, his lower back hurts more.  No FCNAVD.  No B/B Sx.  No foot weakness.  Always some pain but then it will flare occ.  No trauma.  More pain if sitting in some chairs, better in others. >6 weeks sx.  He has been doing stretching per HEP w/o relief.    Meds, vitals, and allergies reviewed.   ROS: Per HPI unless specifically indicated in ROS section   nad ncat rrr abd soft Midline back ttp R lower back ttp Paresthesia R calf with altered sensation.  R SLR positive.  R leg weaker hip flexion and 1st toe dorsiflexion.   No rash Skin well perfused.

## 2019-04-02 NOTE — Patient Instructions (Signed)
Go to the lab on the way out.    Use gabapentin for pain, sedation caution.   We'll work on the MRI and spine clinic referral.   Take care.  Glad to see you.

## 2019-04-03 ENCOUNTER — Ambulatory Visit (HOSPITAL_COMMUNITY)
Admission: RE | Admit: 2019-04-03 | Discharge: 2019-04-03 | Disposition: A | Discharge status: Home / Self Care | Payer: 59 | Source: Ambulatory Visit | Attending: Family Medicine | Admitting: Family Medicine

## 2019-04-03 DIAGNOSIS — R29898 Other symptoms and signs involving the musculoskeletal system: Secondary | ICD-10-CM | POA: Diagnosis present

## 2019-04-03 DIAGNOSIS — M5430 Sciatica, unspecified side: Secondary | ICD-10-CM | POA: Diagnosis present

## 2019-04-03 NOTE — Assessment & Plan Note (Addendum)
Now with weakness, needs plain films them MRI then neurosurgery eval asap re: L spine pathology causing worsening sx/weakness.  At this point the hold up in his care is related to his insurance, not medical care here at the clinic.  He can use gabapentin for pain in the meantime.

## 2019-04-05 ENCOUNTER — Other Ambulatory Visit: Payer: Self-pay | Admitting: Family Medicine

## 2019-04-05 DIAGNOSIS — M5430 Sciatica, unspecified side: Secondary | ICD-10-CM

## 2019-04-05 NOTE — Addendum Note (Signed)
Addended by: Joaquim Nam on: 04/05/2019 05:39 PM   Modules accepted: Orders

## 2019-04-06 ENCOUNTER — Telehealth: Payer: Self-pay

## 2019-04-06 NOTE — Telephone Encounter (Signed)
Will send this note to [facility] and Lugene CMA.

## 2019-04-06 NOTE — Telephone Encounter (Signed)
Received: Today Message Contents  Eddie Candle, Luisa Dago, RN; P [facility]  Good morning [place],   Thank you for forwarding this to the correct location. :)   Thank you,   Alvin Critchley       Previous Messages   ----- Message -----  From: Nolon Lennert, RN  Sent: 04/03/2019  5:29 PM EST  To: Alvin Critchley, [facility]  I looked into this and it appears this patients referral was sent from the [place] office. I will forward it to there office to help assist.   Thank You   Elodia Florence  ----- Message -----  From: Alvin Critchley  Sent: 04/03/2019  5:01 PM EST  To: [facility]   Good evening,   This patient was scheduled for a STAT MR Lumbar Spine wo Contrast today at [facility]. Per the patient's insurance, [facility] Harvey is required and currently not on file. Could someone please assist in obtaining auth for this patient?   Thanks in advance,   Dominica

## 2019-04-06 NOTE — Telephone Encounter (Signed)
MRI Authorization was obtained from [facility]  before patient had the MRI that was scheduled at [facility] on 04/03/2019 at 4:30pm. No other action is needed. Authorization # [id number] 05/18/2019.

## 2019-04-06 NOTE — Telephone Encounter (Signed)
[facility] Night - Client Nonclinical Telephone Record AccessNurse Client [facility] Night - Client Client Site [facility] - Night Physician Raechel Ache - MD Contact Type Call Who Is Calling Patient / Member / Family / Caregiver Caller Name Juan Foster Caller Phone Number [phone] Call Type Message Only Information Provided Reason for Call Returning a Call from the Office Initial Comment Caller states that he received a call about his MRI he had about an hour ago. Additional Comment Office hours provided. Disp. Time Disposition Final User 04/03/2019 6:11:02 PM General Information Provided Yes Mariane Duval

## 2019-04-07 ENCOUNTER — Other Ambulatory Visit: Payer: Self-pay

## 2019-04-07 ENCOUNTER — Ambulatory Visit: Payer: 59 | Admitting: Neurology

## 2019-04-07 ENCOUNTER — Encounter: Payer: Self-pay | Admitting: Neurology

## 2019-04-07 VITALS — BP 111/68 | HR 60 | Temp 97.2°F | Ht 72.0 in | Wt 136.3 lb

## 2019-04-07 DIAGNOSIS — M419 Scoliosis, unspecified: Secondary | ICD-10-CM

## 2019-04-07 DIAGNOSIS — M436 Torticollis: Secondary | ICD-10-CM

## 2019-04-07 DIAGNOSIS — M79604 Pain in right leg: Secondary | ICD-10-CM

## 2019-04-07 DIAGNOSIS — R202 Paresthesia of skin: Secondary | ICD-10-CM

## 2019-04-07 DIAGNOSIS — J9383 Other pneumothorax: Secondary | ICD-10-CM

## 2019-04-07 DIAGNOSIS — R200 Anesthesia of skin: Secondary | ICD-10-CM

## 2019-04-07 LAB — HEMOGLOBIN A1C: Hemoglobin A1C: 5.2

## 2019-04-07 NOTE — Patient Instructions (Signed)
You have a mild degree of numbness in the right leg and right hand.  We will do a cervical spine (i.e. neck) MRI to look for degenerative changes. We will do an EMG and nerve conduction velocity test, which is an electrical nerve and muscle test, which we will schedule. We will call you with the results. We will check blood work today and call you with the test results.

## 2019-04-07 NOTE — Progress Notes (Signed)
Subjective:    Patient ID: Juan Foster is a 38 y.o. male.  HPI      , MD, PhD [facility] [address], [address] [address] [place], [place] [address]  Dear Dr. Duncan,   I saw your patient, Juan Foster, upon your kind request in my neurologic clinic today for initial consultation of his low back pain with right-sided radiation.  The patient is unaccompanied today.  As you know, Juan Foster is a 38-year-old right-handed gentleman with a benign medical history other than incidental finding of scoliosis and history of recurrent spontaneous pneumothorax with status post procedures, who reports right leg pain for the past nearly 3 months.  He has intermittent back pain in the middle of the spine, also in the lumbar region.  He has never had any problems with scoliosis, lately he has had some neck stiffness first thing in the morning but no significant radiating neck pain.  He does have a history of right hand numbness and right arm numbness, feels numb in the right triceps area, this has been going on for a longer time, he attributes this to having his thoracic procedures for pneumothorax.  He feels weak at times, pain is stinging and stabbing in character, is there all the time but varies in intensity.  He has been placed on gabapentin as needed and feels that it has made a big difference.  He denies any significant sciatica type symptoms.  He has no family history of neuropathy or neuromuscular disease such as ALS or myasthenia.  I reviewed your office records from your visit from 02/16/2019 as well as 04/02/2019.  He had a recent lumbar spine MRI on 04/03/2019 and I reviewed the results: IMPRESSION: 1. No impingement in the lumbar spine is identified to explain the patient's progressive symptoms. Aside from subtle levoconvex lumbar scoliosis, bony abnormality is identified. 2. Very minimal disc bulges at L4-5 and L5-S1, without impingement at these  levels. Sometimes when the pain is intense he feels weak.  He has noticed a trembling when he exerts himself.  Pain is primarily in the right lateral thigh area but can go down to the right lateral calf area.  He denies any balance problems, denies any sudden onset of one-sided weakness or numbness or tingling or slurring of speech or droopy face.  He has no left-sided symptoms, he has no facial symptoms.  He has a history of visual disturbance, needed glasses but his vision improved and he no longer uses eyeglasses.  He works as a graphic designer.  He lives with his wife and 5-year-old foster son, they have 4 cats in the household.  He quit smoking some 8 years ago, drinks alcohol occasionally and drinks caffeine in the form of coffee, 2 cups/day and 1 can of soda per day on average, tries to hydrate well with water.  He has been athletic and always slender.  His Past Medical History Is Significant For: Past Medical History:  Diagnosis Date  . History of chicken pox   . 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 Surgery to fuse lungs to cavity wall to prevent collapse  2006    His Family History Is Significant For: Family History  Problem Relation Age of Onset  . COPD Paternal Grandfather   . Sudden death Paternal Grandfather   .   Prostate cancer Neg Hx   . Colon cancer Neg Hx     His Social History Is Significant For: Social History   Socioeconomic History  . Marital status: Married    Spouse name: Not on file  . Number of children: Not on file  . Years of education: Not on file  . Highest education level: Not on file  Occupational History  . Occupation: Psychiatrist: [employer]  Tobacco Use  . Smoking status: Former Smoker    Quit date: 02/13/2011    Years since quitting: 8.1  . Smokeless tobacco: Never Used  Substance and Sexual Activity  .  Alcohol use: Yes    Alcohol/week: 0.0 standard drinks    Comment: 1 bottle of wine/week  . Drug use: No  . Sexual activity: Not on file  Other Topics Concern  . Not on file  Social History Narrative   Exercises at least 3 times a week.   Education:  [employer] [place]   Married 2013   Hydrologist- Scientist, water quality, Development worker, international aid   Enjoys hiking, photography   Social Determinants of Health   Financial Resource Strain:   . Difficulty of Paying Living Expenses: Not on file  Food Insecurity:   . Worried About Charity fundraiser in the Last Year: Not on file  . Ran Out of Food in the Last Year: Not on file  Transportation Needs:   . Lack of Transportation (Medical): Not on file  . Lack of Transportation (Non-Medical): Not on file  Physical Activity:   . Days of Exercise per Week: Not on file  . Minutes of Exercise per Session: Not on file  Stress:   . Feeling of Stress : Not on file  Social Connections:   . Frequency of Communication with Friends and Family: Not on file  . Frequency of Social Gatherings with Friends and Family: Not on file  . Attends Religious Services: Not on file  . Active Member of Clubs or Organizations: Not on file  . Attends Archivist Meetings: Not on file  . Marital Status: Not on file    His Allergies Are:  No Known Allergies:   His Current Medications Are:  Outpatient Encounter Medications as of 04/07/2019  Medication Sig  . gabapentin (NEURONTIN) 100 MG capsule Take 1-3 capsules (100-300 mg total) by mouth 3 (three) times daily.  Marland Kitchen loratadine (CLARITIN) 10 MG tablet Take 1 tablet (10 mg total) by mouth daily.   No facility-administered encounter medications on file as of 04/07/2019.  : Review of Systems:  Out of a complete 14 point review of systems, all are reviewed and negative with the exception of these symptoms as listed below:  Review of Systems  Neurological:       Here  to discuss pain in right leg. Pt reports pain is constant and has worsened over the last 3 months. Reports gabapentin helps with the pain.     Objective:  Neurological Exam  Physical Exam Physical Examination:   Vitals:   04/07/19 0824  BP: 111/68  Pulse: 60  Temp: (!) 97.2 F (36.2 C)    General Examination: The patient is a very pleasant 39 y.o. male in no acute distress. He appears well-developed and well-nourished and well groomed.   HEENT: Normocephalic, atraumatic, pupils are equal, round and reactive to light and accommodation. Funduscopic exam is normal with sharp disc margins noted.  Extraocular tracking is good without limitation to gaze excursion or nystagmus noted. Normal smooth pursuit is noted. Hearing is grossly intact. Face is symmetric with N animation and normal facial sensation. Speech is clear with no dysarthria noted. There is no hypophonia. There is no lip, neck/head, jaw or voice tremor. Neck is supple with full range of passive and active motion. There are no carotid bruits on auscultation. Oropharynx exam reveals: mild mouth dryness, good dental hygiene and no airway crowding. Mallampati is class I. Tongue protrudes centrally and palate elevates symmetrically.   Chest: Clear to auscultation without wheezing, rhonchi or crackles noted.  Heart: S1+S2+0, regular and normal without murmurs, rubs or gallops noted.   Abdomen: Soft, non-tender and non-distended with normal bowel sounds appreciated on auscultation.  Extremities: There is no pitting edema in the distal lower extremities bilaterally. Pedal pulses are intact.  Skin: Warm and dry without trophic changes noted. There are no varicose veins.  Musculoskeletal: exam reveals no obvious joint deformities, tenderness or joint swelling or erythema.   Neurologically:  Mental status: The patient is awake, alert and oriented in all 4 spheres. His immediate and remote memory, attention, language skills and fund of  knowledge are appropriate. There is no evidence of aphasia, agnosia, apraxia or anomia. Speech is clear with normal prosody and enunciation. Thought process is linear. Mood is normal and affect is normal.  Cranial nerves II - XII are as described above under HEENT exam. In addition: shoulder shrug is normal with equal shoulder height noted. Motor exam: Normal bulk, strength and tone is noted. No fasciculations, no focal or generalized atrophy, slender built. There is no drift, tremor or rebound. Romberg is negative. Reflexes are 2+ throughout. Babinski: Toes are flexor bilaterally. Fine motor skills and coordination: intact with normal finger taps, normal hand movements, normal rapid alternating patting, normal foot taps and normal foot agility.  Cerebellar testing: No dysmetria or intention tremor on finger to nose testing. Heel to shin is unremarkable bilaterally. There is no truncal or gait ataxia.  Sensory exam: intact to light touch, pinprick, vibration, temperature sense in the L upper and lower extremities. He has mild decreased pinprick sensation and temperature sensation in the lateral aspect of the right leg, proximal and also slightly in the calf area.  He has mild decrease to pinprick and temperature sense in the right hand in the right digit 1 and 2 area, slight decrease in sensation in the right forearm area, normal to light touch. Gait, station and balance: He stands easily. No veering to one side is noted. No leaning to one side is noted. Posture is age-appropriate and stance is narrow based. Gait shows normal stride length and normal pace. No problems turning are noted. Tandem walk is unremarkable.   Assessment and Plan:    Assessment and Plan:  In summary, Juan Foster is a very pleasant 39 y.o.-year old male with a benign medical history other than incidental finding of scoliosis and history of recurrent spontaneous pneumothorax with status post procedures, who Presents for evaluation  of his right leg pain. I do not have a good explanation of his pain, meralgia paresthetica would be a possibility but he has had more extended pain into the distal leg and also has had symptoms in the right arm, no facial Sx.  He has some midline low back pain, no telltale symptoms of sciatica.  He had A recent lumbar spine MRI with fairly benign findings, no obvious disc herniation.  He also has  some symptoms in the right arm.  On examination, he has some decreased sensation in the right hand and right lower extremity, motor wise he checks out fine, No obvious weakness, no obvious discrepancy in his reflexes, no atrophy.  I suggested we proceed with further work-up in the form of EMG and nerve conduction testing as well as a cervical spine MRI.We will also proceed with blood work today including B12, ESR, TSH, CK level, autoimmune markers, vitamin D level, heavy metals, A1c.  We will keep him posted as to his test results by phone call and arrange for follow-up after testing if needed. He has found symptomatic relief with gabapentin. He is advised to call our office with any interim questions or concerns.  I answered all his questions today and he was in agreement. Thank you very much for allowing me to participate in the care of this nice patient. If I can be of any further assistance to you please do not hesitate to call me at [phone].  Sincerely,    , MD, PhD  

## 2019-04-09 ENCOUNTER — Telehealth: Payer: Self-pay

## 2019-04-09 LAB — RHEUMATOID FACTOR: Rheumatoid fact SerPl-aCnc: <10 IU/mL (ref 0.0–13.9)

## 2019-04-09 LAB — CBC WITH DIFFERENTIAL/PLATELET
Basophils Absolute: 0 10*3/uL (ref 0.0–0.2)
Basos: 1 %
EOS (ABSOLUTE): 0.1 10*3/uL (ref 0.0–0.4)
Eos: 2 %
Hematocrit: 40.9 % (ref 37.5–51.0)
Hemoglobin: 13.8 g/dL (ref 13.0–17.7)
Immature Grans (Abs): 0 10*3/uL (ref 0.0–0.1)
Immature Granulocytes: 0 %
Lymphocytes Absolute: 1.5 10*3/uL (ref 0.7–3.1)
Lymphs: 34 %
MCH: 32.5 pg (ref 26.6–33.0)
MCHC: 33.7 g/dL (ref 31.5–35.7)
MCV: 96 fL (ref 79–97)
Monocytes Absolute: 0.5 10*3/uL (ref 0.1–0.9)
Monocytes: 11 %
Neutrophils Absolute: 2.3 10*3/uL (ref 1.4–7.0)
Neutrophils: 52 %
Platelets: 177 10*3/uL (ref 150–450)
RBC: 4.25 x10E6/uL (ref 4.14–5.80)
RDW: 12.3 % (ref 11.6–15.4)
WBC: 4.4 10*3/uL (ref 3.4–10.8)

## 2019-04-09 LAB — COMPREHENSIVE METABOLIC PANEL
ALT: 35 IU/L (ref 0–44)
AST: 26 IU/L (ref 0–40)
Albumin/Globulin Ratio: 2.4 — ABNORMAL HIGH (ref 1.2–2.2)
Albumin: 5 g/dL (ref 4.0–5.0)
Alkaline Phosphatase: 29 IU/L — ABNORMAL LOW (ref 39–117)
BUN/Creatinine Ratio: 12 (ref 9–20)
BUN: 9 mg/dL (ref 6–20)
Bilirubin Total: 0.9 mg/dL (ref 0.0–1.2)
CO2: 26 mmol/L (ref 20–29)
Calcium: 10.1 mg/dL (ref 8.7–10.2)
Chloride: 97 mmol/L (ref 96–106)
Creatinine, Ser: 0.78 mg/dL (ref 0.76–1.27)
GFR calc Af Amer: 131 mL/min/{1.73_m2} (ref >59)
GFR calc non Af Amer: 114 mL/min/{1.73_m2} (ref >59)
Globulin, Total: 2.1 g/dL (ref 1.5–4.5)
Glucose: 81 mg/dL (ref 65–99)
Potassium: 4 mmol/L (ref 3.5–5.2)
Sodium: 135 mmol/L (ref 134–144)
Total Protein: 7.1 g/dL (ref 6.0–8.5)

## 2019-04-09 LAB — ANA W/REFLEX: Anti Nuclear Antibody (ANA): NEGATIVE

## 2019-04-09 LAB — RPR: RPR Ser Ql: NONREACTIVE

## 2019-04-09 LAB — HEAVY METALS PROFILE II, BLOOD
Arsenic: 6 ug/L (ref 2–23)
Cadmium: <0.5 ug/L (ref 0.0–1.2)
Lead, Blood: <1 ug/dL (ref 0–4)
Mercury: <1 ug/L (ref 0.0–14.9)

## 2019-04-09 LAB — HGB A1C W/O EAG: Hgb A1c MFr Bld: 5.2 % (ref 4.8–5.6)

## 2019-04-09 LAB — TSH: TSH: 0.51 u[IU]/mL (ref 0.450–4.500)

## 2019-04-09 LAB — SEDIMENTATION RATE: Sed Rate: 2 mm/hr (ref 0–15)

## 2019-04-09 LAB — VITAMIN D 25 HYDROXY (VIT D DEFICIENCY, FRACTURES): Vit D, 25-Hydroxy: 11.3 ng/mL — ABNORMAL LOW (ref 30.0–100.0)

## 2019-04-09 LAB — B12 AND FOLATE PANEL
Folate: 12.9 ng/mL (ref >3.0)
Vitamin B-12: 282 pg/mL (ref 232–1245)

## 2019-04-09 LAB — CK: Total CK: 99 U/L (ref 49–439)

## 2019-04-09 NOTE — Telephone Encounter (Signed)
I contacted the pt and left a vm(ok per dpr) advising of results.  Pt was advised to call back if he had any questions.

## 2019-04-09 NOTE — Telephone Encounter (Signed)
-----   Message from Huston Foley, MD sent at 04/09/2019  7:53 AM EST ----- Labs were fine, with the exception of low Vitamin D level at 11.3 (normal range is around 30-100). I would recommend that patient start an OTC Vitamin D supplement: 2000 units daily of any vitamin D supplement of His choice should be fine. I would recommend recheck of vitamin D status in 3-6 months with PCP; he can talk to his PCP about prescription strength vit D, too. Please update pt. Janene Harvey

## 2019-04-09 NOTE — Progress Notes (Signed)
Labs were fine, with the exception of low Vitamin D level at 11.3 (normal range is around 30-100). I would recommend that patient start an OTC Vitamin D supplement: 2000 units daily of any vitamin D supplement of His choice should be fine. I would recommend recheck of vitamin D status in 3-6 months with PCP; he can talk to his PCP about prescription strength vit D, too. Please update pt. Juan Foster

## 2019-04-21 ENCOUNTER — Encounter: Payer: Self-pay | Admitting: Family Medicine

## 2019-04-21 ENCOUNTER — Other Ambulatory Visit: Payer: Self-pay

## 2019-04-21 ENCOUNTER — Ambulatory Visit: Payer: 59 | Admitting: Family Medicine

## 2019-04-21 VITALS — BP 108/70 | HR 63 | Temp 96.9°F | Ht 72.0 in | Wt 141.1 lb

## 2019-04-21 DIAGNOSIS — M5430 Sciatica, unspecified side: Secondary | ICD-10-CM | POA: Diagnosis not present

## 2019-04-21 DIAGNOSIS — E559 Vitamin D deficiency, unspecified: Secondary | ICD-10-CM | POA: Diagnosis not present

## 2019-04-21 MED ORDER — VITAMIN D 50 MCG (2000 UT) PO CAPS: 2000.0000 [IU] | ORAL_CAPSULE | Freq: Every day | ORAL | Status: DC

## 2019-04-21 MED ORDER — GABAPENTIN 100 MG PO CAPS: 100.0000 mg | ORAL_CAPSULE | Freq: Three times a day (TID) | ORAL | 1 refills | Status: DC

## 2019-04-21 NOTE — Patient Instructions (Signed)
Add on vitamin D and recheck in about 3 months.  I'll check with neuro in the meantime.  Gradually increase the gabapentin as needed for pain.  Take care.  Glad to see you.

## 2019-04-21 NOTE — Progress Notes (Signed)
This visit occurred during the SARS-CoV-2 public health emergency.  Safety protocols were in place, including screening questions prior to the visit, additional usage of staff PPE, and extensive cleaning of exam room while observing appropriate contact time as indicated for disinfecting solutions.  He thought his pain was better with gabapentin then got worse last week.  More pain 2 days ago, R leg.  Same distribution of pain but worse overall, esp yesterday.  He has to sit leaning to the left due to pain.  He was taking 200mg  gabapentin BID/TID. He has R foot lateral numbness.  He still has R hand weakness, compared to L hand, old finding.  Prednisone didn't help prev.  He isn't drowsy with gabapentin.    H/o low vit d, he is going to start replacement soon.  D/w pt.   He has MRI neck pending.    Meds, vitals, and allergies reviewed.   ROS: Per HPI unless specifically indicated in ROS section   GEN: nad, alert and oriented HEENT: ncat NECK: supple w/o LA CV: rrr.  PULM: ctab, no inc wob ABD: soft, +bs EXT: no edema SKIN: no acute rash Right hand with decreased grip he also has some decreased strength at the right biceps.  Normal strength in the left upper extremity. He has decreased strength with right first toe flexion and extension, normal on the left. Reflexes are symmetric for the bilateral upper extremities but he has a slight decrease in his right patellar reflex. Cranial nerves intact and symmetric.

## 2019-04-22 DIAGNOSIS — E559 Vitamin D deficiency, unspecified: Secondary | ICD-10-CM | POA: Insufficient documentation

## 2019-04-22 NOTE — Assessment & Plan Note (Signed)
This may be unrelated but he will start replacement in the meantime and recheck a level in 3 months.

## 2019-04-22 NOTE — Assessment & Plan Note (Signed)
With unremarkable MRI L-spine but also with right arm findings as described above.  He has follow-up MRI neck pending and I will check with neurology about this.  Reasonable to increase gabapentin as needed for pain in the meantime and he will update me as needed.

## 2019-04-27 ENCOUNTER — Telehealth: Payer: Self-pay | Admitting: Neurology

## 2019-04-27 NOTE — Telephone Encounter (Signed)
no to the covid questions MR Cervical spine wo contrast Dr. Frances Furbish [facility] Auth: [id number] (exp. 04/16/19 to 05/31/19). Patient is scheduled at [facility] for 04/28/19.

## 2019-04-28 ENCOUNTER — Ambulatory Visit: Payer: 59

## 2019-04-28 ENCOUNTER — Other Ambulatory Visit: Payer: Self-pay

## 2019-04-28 DIAGNOSIS — M436 Torticollis: Secondary | ICD-10-CM

## 2019-04-28 DIAGNOSIS — R202 Paresthesia of skin: Secondary | ICD-10-CM

## 2019-04-28 DIAGNOSIS — M419 Scoliosis, unspecified: Secondary | ICD-10-CM | POA: Diagnosis not present

## 2019-04-28 DIAGNOSIS — M79604 Pain in right leg: Secondary | ICD-10-CM

## 2019-04-28 DIAGNOSIS — R200 Anesthesia of skin: Secondary | ICD-10-CM | POA: Diagnosis not present

## 2019-04-28 DIAGNOSIS — R937 Abnormal findings on diagnostic imaging of other parts of musculoskeletal system: Secondary | ICD-10-CM

## 2019-04-28 DIAGNOSIS — J9383 Other pneumothorax: Secondary | ICD-10-CM

## 2019-04-29 ENCOUNTER — Telehealth: Payer: Self-pay | Admitting: Family Medicine

## 2019-04-29 NOTE — Telephone Encounter (Signed)
Opened in error.  Patient had MRI yesterday and I will await update from neurology.

## 2019-04-30 ENCOUNTER — Ambulatory Visit: Payer: 59 | Attending: Internal Medicine

## 2019-04-30 DIAGNOSIS — Z20822 Contact with and (suspected) exposure to covid-19: Secondary | ICD-10-CM

## 2019-04-30 NOTE — Progress Notes (Signed)
Please call patient regarding his cervical spine MRI without contrast from 04/28/2019.  While there is no evidence of a disc herniation or cord compression, there is a small spot in his spinal cord which does not cause any swelling but it does have an abnormal signal at level C3-4.  To be sure, and to further delineate this spot, I recommend we proceed with a repeat cervical spine MRI with contrast. In addition, the [place] neurologist recommended a brain MRI with and without contrast to make sure there are not any additional spots on the brain, I will order a brain MRI with and without contrast and a repeat cervical spine MRI with contrast. Please let me know if he has any reservation against proceeding with these additional scans.

## 2019-05-01 LAB — NOVEL CORONAVIRUS, NAA: SARS-CoV-2, NAA: NOT DETECTED

## 2019-05-04 ENCOUNTER — Telehealth: Payer: Self-pay

## 2019-05-04 NOTE — Telephone Encounter (Signed)
-----   Message from Huston Foley, MD sent at 04/30/2019  4:34 PM EST ----- Please call patient regarding his cervical spine MRI without contrast from 04/28/2019.  While there is no evidence of a disc herniation or cord compression, there is a small spot in his spinal cord which does not cause any swelling but it does have an abnormal signal at level C3-4.  To be sure, and to further delineate this spot, I recommend we proceed with a repeat cervical spine MRI with contrast. In addition, the [place] neurologist recommended a brain MRI with and without contrast to make sure there are not any additional spots on the brain, I will order a brain MRI with and without contrast and a repeat cervical spine MRI with contrast. Please let me know if he has any reservation against proceeding with these additional scans.

## 2019-05-04 NOTE — Telephone Encounter (Signed)
I reached out to the pt and advised of results.  Pt was agreeable to the additional studies and had no further questions at this time.

## 2019-05-18 ENCOUNTER — Encounter: Payer: Self-pay | Admitting: Neurology

## 2019-05-18 ENCOUNTER — Ambulatory Visit (INDEPENDENT_AMBULATORY_CARE_PROVIDER_SITE_OTHER): Payer: 59 | Admitting: Neurology

## 2019-05-18 ENCOUNTER — Other Ambulatory Visit: Payer: Self-pay

## 2019-05-18 ENCOUNTER — Ambulatory Visit: Payer: 59 | Admitting: Neurology

## 2019-05-18 DIAGNOSIS — M5430 Sciatica, unspecified side: Secondary | ICD-10-CM

## 2019-05-18 DIAGNOSIS — R202 Paresthesia of skin: Secondary | ICD-10-CM

## 2019-05-18 DIAGNOSIS — J9383 Other pneumothorax: Secondary | ICD-10-CM

## 2019-05-18 DIAGNOSIS — M79604 Pain in right leg: Secondary | ICD-10-CM | POA: Diagnosis not present

## 2019-05-18 DIAGNOSIS — R200 Anesthesia of skin: Secondary | ICD-10-CM

## 2019-05-18 DIAGNOSIS — M436 Torticollis: Secondary | ICD-10-CM

## 2019-05-18 DIAGNOSIS — M419 Scoliosis, unspecified: Secondary | ICD-10-CM

## 2019-05-18 NOTE — Procedures (Signed)
     HISTORY:  Juan Foster is a 39 year old gentleman with a several month history of onset of right lower extremity discomfort that is mainly in the anterolateral aspect from the hip level to just below the knee.  The patient feels as if the right leg is slightly weak as well.  The pain will come and go, at times the pain is quite minimal.  He is being evaluated for this issue.  He reports a chronic issue with some right arm numbness since a thoracentesis procedure for several episodes of pneumothorax.  NERVE CONDUCTION STUDIES:  Nerve conduction studies were performed on the right upper extremity. The distal motor latencies and motor amplitudes for the median and ulnar nerves were within normal limits. The nerve conduction velocities for these nerves were also normal. The sensory latencies for the median and ulnar nerves were normal. The F wave latency for the ulnar nerve was within normal limits.  Nerve conduction studies were performed on the right lower extremity. The distal motor latencies and motor amplitudes for the peroneal and posterior tibial nerves were within normal limits. The nerve conduction velocities for these nerves were also normal. The sensory latencies for the peroneal and sural nerves were within normal limits. The F wave latency for the posterior tibial nerve was within normal limits.   EMG STUDIES:  EMG study was performed on the right lower extremity:  The tibialis anterior muscle reveals 2 to 4K motor units with full recruitment. No fibrillations or positive waves were seen. The peroneus tertius muscle reveals 2 to 4K motor units with full recruitment. No fibrillations or positive waves were seen. The medial gastrocnemius muscle reveals 1 to 3K motor units with full recruitment. No fibrillations or positive waves were seen. The vastus lateralis muscle reveals 2 to 4K motor units with full recruitment. No fibrillations or positive waves were seen. The abductor magnus  muscle reveals 2 to 4K motor units with full recruitment.  No fibrillations or positive waves were seen. The iliopsoas muscle reveals 1 to 3K motor units with minimally reduced recruitment. 3+ fibrillations and positive waves were seen. The gluteus medius muscle reveals 1 to 3K motor units with full recruitment.  No fibrillations or positive waves were noted. The biceps femoris muscle (long head) reveals 2 to 4K motor units with full recruitment. No fibrillations or positive waves were seen. The lumbosacral paraspinal muscles were tested at 3 levels, and revealed no abnormalities of insertional activity at all 3 levels tested. There was good relaxation.   IMPRESSION:  Nerve conduction studies done on the right upper and right lower extremities were unremarkable, no evidence of a neuropathy is seen.  EMG evaluation of the right lower extremity was unremarkable with exception of isolated acute denervation seen in the iliopsoas muscle only, the clinical significance of this is not clear, a lumbosacral plexopathy should be considered.  [name] MD 05/18/2019 4:25 PM  [facility] [address] [place], [place] [address]  Phone [phone] Fax [phone]

## 2019-05-18 NOTE — Progress Notes (Signed)
Please refer to EMG and nerve conduction procedure note.  

## 2019-05-18 NOTE — Progress Notes (Signed)
[facility]    Nerve / Sites Muscle Latency Ref. Amplitude Ref. Rel Amp Segments Distance Velocity Ref. Area    ms ms mV mV %  cm m/s m/s mVms  R Median - APB     Wrist APB 2.9 ?4.4 13.0 ?4.0 100 Wrist - APB 7   49.4     Upper arm APB 7.4  12.3  95.1 Upper arm - Wrist 25 56 ?49 46.3  R Ulnar - ADM     Wrist ADM 2.8 ?3.3 13.7 ?6.0 100 Wrist - ADM 7   43.4     B.Elbow ADM 6.6  12.9  94.7 B.Elbow - Wrist 23 62 ?49 42.6     A.Elbow ADM 8.2  12.6  97.2 A.Elbow - B.Elbow 10 62 ?49 43.4         A.Elbow - Wrist      R Peroneal - EDB     Ankle EDB 4.3 ?6.5 9.6 ?2.0 100 Ankle - EDB 9   26.2     Fib head EDB 10.2  9.3  96.2 Fib head - Ankle 30 50 ?44 25.6     Pop fossa EDB 12.3  8.9  95.5 Pop fossa - Fib head 10 49 ?44 25.2         Pop fossa - Ankle      R Tibial - AH     Ankle AH 3.3 ?5.8 12.7 ?4.0 100 Ankle - AH 9   30.0     Pop fossa AH 12.1  11.3  88.8 Pop fossa - Ankle 40 45 ?41 29.3             SNC    Nerve / Sites Rec. Site Peak Lat Ref.  Amp Ref. Segments Distance    ms ms V V  cm  R Sural - Ankle (Calf)     Calf Ankle 3.6 ?4.4 13 ?6 Calf - Ankle 14  R Superficial peroneal - Ankle     Lat leg Ankle 3.5 ?4.4 17 ?6 Lat leg - Ankle 14  R Median - Orthodromic (Dig II, Mid palm)     Dig II Wrist 2.8 ?3.4 27 ?10 Dig II - Wrist 13  R Ulnar - Orthodromic, (Dig V, Mid palm)     Dig V Wrist 2.6 ?3.1 11 ?5 Dig V - Wrist 72             F  Wave    Nerve F Lat Ref.   ms ms  R Tibial - AH 52.3 ?56.0  R Ulnar - ADM 29.2 ?32.0

## 2019-05-18 NOTE — Progress Notes (Signed)
Please call patient and advise him that the recent EMG nerve conduction velocity test from 05/18/2019 showed no significant abnormalities, no obvious widespread nerve damage or what we call neuropathy was seen.  He had some evidence of pinched nerve type changes in the right lower extremity, the clinical significance of this was not fully clear, he may have degenerative lumbar spine disease and pinched nerve type changes, but not severe.    Based on his cervical spine MRI without contrast I had ordered a brain MRI with and without contrast and a cervical spine MRI with contrast.  I do not see where he has been scheduled.  Please inquire and if need be talk to [place] about this.

## 2019-05-19 ENCOUNTER — Telehealth: Payer: Self-pay

## 2019-05-19 NOTE — Telephone Encounter (Signed)
Pt returned my call and I discussed these results and recommendations with him. Pt is willing to complete MRI cervical and brain. He has not heard anything further about the MRIs but is willing to schedule them. Pt verbalized understanding of results. Pt had no questions at this time but was encouraged to call back if questions arise.

## 2019-05-19 NOTE — Telephone Encounter (Signed)
-----   Message from Huston Foley, MD sent at 05/18/2019  6:10 PM EDT ----- Please call patient and advise him that the recent EMG nerve conduction velocity test from 05/18/2019 showed no significant abnormalities, no obvious widespread nerve damage or what we call neuropathy was seen.  He had some evidence of pinched nerve type changes in the right lower extremity, the clinical significance of this was not fully clear, he may have degenerative lumbar spine disease and pinched nerve type changes, but not severe.    Based on his cervical spine MRI without contrast I had ordered a brain MRI with and without contrast and a cervical spine MRI with contrast.  I do not see where he has been scheduled.  Please inquire and if need be talk to [place] about this.

## 2019-05-19 NOTE — Telephone Encounter (Signed)
I called pt to discuss his results. No answer, left a message asking him to call me back.

## 2019-05-20 NOTE — Telephone Encounter (Signed)
I never saw the MRI's they never dropped in my workque. They are pending now I faxed clinical notes.

## 2019-05-21 NOTE — Telephone Encounter (Signed)
MRI Brain w/wo contrast was approved [id number] (exp. 05/20/19 to 07/04/19)  MRI Cervical spine w contrast is still in medical review.

## 2019-05-26 NOTE — Telephone Encounter (Signed)
Letter created, signed by Dr. Frances Furbish. Given to [place].

## 2019-05-26 NOTE — Telephone Encounter (Signed)
Baxter Hire, can you please put in letter form:  To whom it may concern,  The above-named patient has a history of right arm numbness.  He had a cervical spine MRI without contrast on 04/28/2019 which did not show any significant degenerative changes or herniated disc.  He was noted to have a spinal cord abnormality, however, which was reported as an ill-defined hyperintensity signal dorsally at C3-4.  Given his clinical presentation and abnormality noted on his spinal cord. I feel it is imperative that we follow-up with a cervical spine MRI with and without contrast to better define the spinal cord lesion and if it is clinically relevant, we may have to send him for further evaluations with a specialist such as a neurosurgeon, or neuro-immunologist, or MS specialist, depending on the results of the contrasted cervical spine MRI as well as the brain MRI with and without contrast. These may alter the course of his treatment and are therefore clinically relevant and justified. Correlation with a postcontrast MRI of the cervical spine and brain was also recommended by the interpreting physician of the cervical spine MRI without contrast from 04/28/2019.  Please allow [place] to proceed with the cervical spine MRI with and without contrast.

## 2019-05-26 NOTE — Telephone Encounter (Signed)
[employer] did not approve the MR Cervical spine with contrast.. they are letting me do a reconsideration They are needing a letter written on the reason why this exam is needed since he just had the MRI Cervical spine wo contrast.. I have already faxed them once the results of that study. I will fax them again along with the letter to do the reconsideration.

## 2019-05-26 NOTE — Telephone Encounter (Signed)
Noted, faxed the letter with all the other information for reconsideration.

## 2019-05-28 NOTE — Telephone Encounter (Signed)
The Cervical spine was approved   Brain. [id number] (exp. 05/20/19 to 07/04/19)  Cervical: [id number] (exp. 05/27/19 to 07/11/19)  I left a voicemail for the patient to call back about scheduling his MRI's.

## 2019-05-28 NOTE — Telephone Encounter (Signed)
Patient returned my call he is scheduled at [facility] for 06/02/19.

## 2019-06-02 ENCOUNTER — Ambulatory Visit (INDEPENDENT_AMBULATORY_CARE_PROVIDER_SITE_OTHER): Payer: 59

## 2019-06-02 ENCOUNTER — Other Ambulatory Visit: Payer: Self-pay

## 2019-06-02 DIAGNOSIS — R200 Anesthesia of skin: Secondary | ICD-10-CM

## 2019-06-02 DIAGNOSIS — M436 Torticollis: Secondary | ICD-10-CM | POA: Diagnosis not present

## 2019-06-02 DIAGNOSIS — M79604 Pain in right leg: Secondary | ICD-10-CM

## 2019-06-02 DIAGNOSIS — R202 Paresthesia of skin: Secondary | ICD-10-CM | POA: Diagnosis not present

## 2019-06-02 DIAGNOSIS — R937 Abnormal findings on diagnostic imaging of other parts of musculoskeletal system: Secondary | ICD-10-CM

## 2019-06-02 DIAGNOSIS — M419 Scoliosis, unspecified: Secondary | ICD-10-CM

## 2019-06-02 DIAGNOSIS — J9383 Other pneumothorax: Secondary | ICD-10-CM

## 2019-06-02 MED ORDER — GADOBENATE DIMEGLUMINE 529 MG/ML IV SOLN
12.0000 mL | Freq: Once | INTRAVENOUS | Status: AC | PRN
  Administered 2019-06-02: 08:00:00 12 mL via INTRAVENOUS

## 2019-06-04 ENCOUNTER — Telehealth: Payer: Self-pay | Admitting: Neurology

## 2019-06-04 DIAGNOSIS — G35 Multiple sclerosis: Secondary | ICD-10-CM

## 2019-06-04 DIAGNOSIS — R9089 Other abnormal findings on diagnostic imaging of central nervous system: Secondary | ICD-10-CM

## 2019-06-04 DIAGNOSIS — R937 Abnormal findings on diagnostic imaging of other parts of musculoskeletal system: Secondary | ICD-10-CM

## 2019-06-04 DIAGNOSIS — G379 Demyelinating disease of central nervous system, unspecified: Secondary | ICD-10-CM

## 2019-06-04 DIAGNOSIS — R200 Anesthesia of skin: Secondary | ICD-10-CM

## 2019-06-04 DIAGNOSIS — M79604 Pain in right leg: Secondary | ICD-10-CM

## 2019-06-04 NOTE — Telephone Encounter (Signed)
Called pt, scheduled work in appt for 06/16/19 at 9am with Dr. Epimenio Foot. MD approved this. Pt verbalized understanding and appreciation.

## 2019-06-04 NOTE — Telephone Encounter (Signed)
no to the covid questions MR Thoracic spine w/wo contrast Dr. Frances Furbish [facility] Auth: [id number] (exp. 06/04/19 to 07/19/19). Patient is scheduled at [facility] for 06/10/19.

## 2019-06-04 NOTE — Telephone Encounter (Signed)
I talked to the patient regarding his recent brain MRI as well as cervical spine MRI with contrast.  He was advised that his MRI findings would support the possibility of underlying multiple sclerosis.  The cervical spine lesion at C3-4 is still visible, may not be an active spot and may not explain his new onset right leg pain.  It may explain his right upper extremity numbness and tingling which he has had for years, but he reports that he had numbness and tingling in his right hand since his 20s even.  He has not had any new symptoms thankfully, feels stable clinically.  I advised him that I had a chance to talk to Dr. Epimenio Foot who specializes in MS Care, and that I reviewed his scans with the help of our MS specialist last night personally. I favor that he establish care with Dr. Epimenio Foot who has kindly agreed to take him on as a patient.  In addition, and in preparation for his visit with Dr. Epimenio Foot I would like to proceed with a thoracic spine MRI with and without contrast to see if there is any obvious explanation for his right lower extremity symptoms.  Juan Foster is agreeable with the plan and with the additional scan.

## 2019-06-10 ENCOUNTER — Other Ambulatory Visit: Payer: 59

## 2019-06-16 ENCOUNTER — Encounter: Payer: Self-pay | Admitting: Neurology

## 2019-06-16 ENCOUNTER — Ambulatory Visit: Payer: 59 | Admitting: Neurology

## 2019-06-16 ENCOUNTER — Other Ambulatory Visit: Payer: Self-pay

## 2019-06-16 VITALS — BP 107/67 | HR 55 | Temp 97.6°F | Ht 72.0 in | Wt 136.8 lb

## 2019-06-16 DIAGNOSIS — R202 Paresthesia of skin: Secondary | ICD-10-CM

## 2019-06-16 DIAGNOSIS — R200 Anesthesia of skin: Secondary | ICD-10-CM

## 2019-06-16 DIAGNOSIS — M79604 Pain in right leg: Secondary | ICD-10-CM

## 2019-06-16 DIAGNOSIS — G35 Multiple sclerosis: Secondary | ICD-10-CM

## 2019-06-16 MED ORDER — GABAPENTIN 300 MG PO CAPS: 300.0000 mg | ORAL_CAPSULE | Freq: Three times a day (TID) | ORAL | 5 refills | Status: DC

## 2019-06-16 NOTE — Progress Notes (Signed)
[facility]  PATIENT: Juan Foster DOB: Aug 07, 1980  REFERRING DOCTOR OR PCP:     Crawford Givens, MD (PCP); referred by Huston Foley, MD PhD,  neurology SOURCE: Patient, notes from Dr. Frances Furbish, imaging and laboratory reports, MRI images personally reviewed.  _________________________________   HISTORICAL  CHIEF COMPLAINT:  Chief Complaint  Patient presents with  . New Patient (Initial Visit)    RM 13, alone. Internal referral from Dr. Frances Furbish for MS. MRI's can be viewed in epic. He has no family hx of MS. Father screened for MS about 20 years ago. Saw multiple doctors. Eventually told he did not have Ms, has been doing well since.     HISTORY OF PRESENT ILLNESS:  I had the pleasure seeing you Mr. Juan Foster at the MS center at [facility] for a second opinion consultation regarding his recent diagnosis of multiple sclerosis.  He is a 39 year old man who had a dysesthetic pain in his right leg in November 2020.  Although symptoms change some they persisted and he had an MRI in January 2021 that just showed minimal DJD.  He also had arm numbness. He was referred to Dr. Frances Furbish who ordered MRI of the cervical spine which showed a subtle focus at [facility].    MRI of the brain showed multiple T2/FLAIR hyperintense foci in the hemispheres, some radially oriented to the ventricles.  2 foci enhance.  Currently, his only symptom is dysesthesia in the right thigh.    He has had constant numbness in his right hand since he was in his twenties noticed after he had his lungs fused to his pleura due to multiple collapses.    Since then he has had numbness in the axilla down the arm.  He feels his right leg is mildly weak and fatigue easy.   He can run and walk unlimited.    He notes the right hand will shake if he is doing a task requiring more task.   Bladder function is fine.   Vision is fine.  He notes no fatigue and sleeps 5-6 hours most nights, feeling refreshed in  the morning.    He denies mood or cognitive issues.  Data personally reviewed today: MRI lumbar spine 04/03/2019: There is minimal disc bulging at L5-S1.  The study is otherwise normal and there is no nerve root compression or spinal stenosis.  MRI of the cervical spine 04/28/2019 shows T2 hyperintense signal posterolaterally to the right adjacent to C4.  MRI of the brain 06/02/2019 shows multiple T2/FLAIR hyperintense foci in the periventricular, juxtacortical and deep white matter.  2 of the foci enhance after contrast.  No lesions are noted in the posterior fossa.  REVIEW OF SYSTEMS: Constitutional: No fevers, chills, sweats, or change in appetite Eyes: No visual changes, double vision, eye pain Ear, nose and throat: No hearing loss, ear pain, nasal congestion, sore throat Cardiovascular: No chest pain, palpitations Respiratory: No shortness of breath at rest or with exertion.   No wheezes GastrointestinaI: No nausea, vomiting, diarrhea, abdominal pain, fecal incontinence Genitourinary: No dysuria, urinary retention or frequency.  No nocturia. Musculoskeletal: No neck pain, back pain Integumentary: No rash, pruritus, skin lesions Neurological: as above Psychiatric: No depression at this time.  No anxiety Endocrine: No palpitations, diaphoresis, change in appetite, change in weigh or increased thirst Hematologic/Lymphatic: No anemia, purpura, petechiae. Allergic/Immunologic: No itchy/runny eyes, nasal congestion, recent allergic reactions, rashes  ALLERGIES: No Known Allergies  HOME MEDICATIONS:  Current Outpatient Medications:  .  Cholecalciferol (VITAMIN D) 50 MCG (2000 UT) CAPS, Take 1 capsule (2,000 Units total) by mouth daily., Disp: , Rfl:  .  gabapentin (NEURONTIN) 300 MG capsule, Take 1 capsule (300 mg total) by mouth 3 (three) times daily., Disp: 90 capsule, Rfl: 5 .  loratadine (CLARITIN) 10 MG tablet, Take 1 tablet (10 mg total) by mouth daily. (Patient taking  differently: Take 10 mg by mouth daily as needed. ), Disp: , Rfl:   PAST MEDICAL HISTORY: Past Medical History:  Diagnosis Date  . History of chicken pox   . 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 Surgery to fuse lungs to cavity wall to prevent collapse  2006    FAMILY HISTORY: Family History  Problem Relation Age of Onset  . COPD Paternal Grandfather   . Sudden death Paternal Grandfather   . Prostate cancer Neg Hx   . Colon cancer Neg Hx     SOCIAL HISTORY:  Social History   Socioeconomic History  . Marital status: Married    Spouse name: Not on file  . Number of children: Not on file  . Years of education: Not on file  . Highest education level: Not on file  Occupational History  . Occupation: Adult nurse: [employer]  Tobacco Use  . Smoking status: Former Smoker    Quit date: 02/13/2011    Years since quitting: 8.3  . Smokeless tobacco: Never Used  Substance and Sexual Activity  . Alcohol use: Yes    Alcohol/week: 0.0 standard drinks    Comment: 1 bottle of wine/month  . Drug use: No  . Sexual activity: Not on file  Other Topics Concern  . Not on file  Social History Narrative   Exercises at least 3 times a week.   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 Difficulty of Paying Living Expenses:   Food Insecurity:   . Worried About Programme researcher, broadcasting/film/video in the Last Year:   . Barista in the Last Year:   Transportation Needs:   . Freight forwarder (Medical):   Marland Kitchen Lack of Transportation (Non-Medical):   Physical Activity:   . Days of Exercise per Week:   . Minutes of Exercise per Session:     Stress:   . Feeling of Stress :   Social Connections:   . Frequency of Communication with Friends and Family:   . Frequency of Social Gatherings with Friends and Family:   . Attends Religious Services:   . Active Member of Clubs or Organizations:   . Attends Banker Meetings:   Marland Kitchen Marital Status:   Intimate Partner Violence:   . Fear of Current or Ex-Partner:   . Emotionally Abused:   Marland Kitchen Physically Abused:   . Sexually Abused:      PHYSICAL EXAM  Vitals:   06/16/19 0847  BP: 107/67  Pulse: (!) 55  Temp: 97.6 F (36.4 C)  Weight: 136 lb 12.8 oz (62.1 kg)  Height: 6' (1.829 m)    Body mass index  is 18.55 kg/m.   Hearing Screening   125Hz  250Hz  500Hz  1000Hz  2000Hz  3000Hz  4000Hz  6000Hz  8000Hz   Right ear:           Left ear:             Visual Acuity Screening   Right eye Left eye Both eyes  Without correction:     With correction: 20/40 20/30 20/20      General: The patient is well-developed and well-nourished and in no acute distress  HEENT:  Head is [place]/AT.  Sclera are anicteric.    Neck: No carotid bruits are noted.  The neck is nontender.  Cardiovascular: The heart has a regular rate and rhythm with a normal S1 and S2. There were no murmurs, gallops or rubs.    Skin: Extremities are without rash or  edema.  Musculoskeletal:  Back is nontender  Neurologic Exam  Mental status: The patient is alert and oriented x 3 at the time of the examination. The patient has apparent normal recent and remote memory, with an apparently normal attention span and concentration ability.   Speech is normal.  Cranial nerves: Extraocular movements are full. Pupils are equal, round, and reactive to light and accomodation.  Color vision is normal.  There is good facial sensation to soft touch bilaterally.Facial strength is normal.  Trapezius and sternocleidomastoid strength is normal. No dysarthria is noted.    No obvious hearing deficits are noted.  Motor:  Muscle  bulk is normal.   Tone is normal. Strength is  5 / 5 in all 4 extremities.   Sensory: Sensory testing is intact to pinprick, soft touch and vibration sensation elswhere in limbs but reduced touch from axilla/adjacent upper arm to +/- T6  Coordination: Cerebellar testing reveals good finger-nose-finger and heel-to-shin bilaterally.  Gait and station: Station is normal.   Gait is normal. Tandem gait is normal. Romberg is negative.   Reflexes: Deep tendon reflexes are symmetric and normal bilaterally.   Plantar responses are flexor.    DIAGNOSTIC DATA (LABS, IMAGING, TESTING) - I reviewed patient records, labs, notes, testing and imaging myself where available.  Lab Results  Component Value Date   WBC 4.4 04/07/2019   HGB 13.8 04/07/2019   HCT 40.9 04/07/2019   MCV 96 04/07/2019   PLT 177 04/07/2019      Component Value Date/Time   NA 135 04/07/2019 0921   NA 138 06/20/2014 1855   K 4.0 04/07/2019 0921   K 4.3 06/20/2014 1855   CL 97 04/07/2019 0921   CL 101 06/20/2014 1855   CO2 26 04/07/2019 0921   CO2 28 06/20/2014 1855   GLUCOSE 81 04/07/2019 0921   GLUCOSE 82 09/26/2016 1859   GLUCOSE 84 06/20/2014 1855   BUN 9 04/07/2019 0921   BUN 8 06/20/2014 1855   CREATININE 0.78 04/07/2019 0921   CREATININE 0.92 06/20/2014 1855   CALCIUM 10.1 04/07/2019 0921   CALCIUM 9.4 06/20/2014 1855   PROT 7.1 04/07/2019 0921   PROT 8.1 06/20/2014 1855   ALBUMIN 5.0 04/07/2019 0921   ALBUMIN 5.2 (H) 06/20/2014 1855   AST 26 04/07/2019 0921   AST 29 06/20/2014 1855   ALT 35 04/07/2019 0921   ALT 26 06/20/2014 1855   ALKPHOS 29 (L) 04/07/2019 0921   ALKPHOS 42 06/20/2014 1855   BILITOT 0.9 04/07/2019 0921   BILITOT 0.5 06/20/2014 1855   GFRNONAA 114 04/07/2019 0921   GFRNONAA >60 06/20/2014 1855   GFRAA 131 04/07/2019 0921   GFRAA >  60 06/20/2014 1855   Lab Results  Component Value Date   CHOL 157 09/26/2016   HDL 56.20 09/26/2016   LDLCALC 85 09/26/2016   TRIG 82.0 09/26/2016    CHOLHDL 3 09/26/2016   Lab Results  Component Value Date   HGBA1C 5.2 04/07/2019   Lab Results  Component Value Date   VITAMINB12 282 04/07/2019   Lab Results  Component Value Date   TSH 0.510 04/07/2019       ASSESSMENT AND PLAN  Multiple sclerosis (HCC)  Numbness and tingling of right leg  Right leg pain  In summary, Mr. Meir is a 39 year old man with dysesthesias who was found to have a focus within the spinal cord leading to additional studies showing multiple T2/flair hyperintense foci in a pattern consistent with MS.  2 of the foci were enhancing.  He meets the McDonald criteria for multiple sclerosis.  We had a conversation about different disease modifying therapies.  Due to having a spinal cord plaque, he is presenting with average level of aggressiveness.  We discussed options.  An oral agent might offer her the best balance of convenience, safety and efficacy.  We discussed options including Vumerity and Zeposia.  I also discussed that we are participating in an open label study to gain additional cognitive data for Zeposia.  He is potentially interested in that study and I informed our research staff who discussed some of the details with him.  He will give this more thought and our research staff will contact him shortly.  If he decides not to proceed with the study he is still interested in [place] get him started on this medication on the commercial side.  If he decides to enroll in the study he will be scheduled for a screening visit.    I showed him the MRI images and we discussed some aspects of multiple sclerosis.  50-minute office visit with the majority of the time spent face-to-face for history and physical, discussion/counseling and decision-making.  Additional time with record review and documentation.   Nysha Koplin A. Felecia Shelling, MD, [facility] 7/62/2633, 3:54 PM Certified in Neurology, Clinical Neurophysiology, Sleep Medicine and Neuroimaging  [facility] [address], [place], [place] [address] [phone]

## 2019-06-17 ENCOUNTER — Ambulatory Visit (INDEPENDENT_AMBULATORY_CARE_PROVIDER_SITE_OTHER): Payer: 59

## 2019-06-17 DIAGNOSIS — R9089 Other abnormal findings on diagnostic imaging of central nervous system: Secondary | ICD-10-CM

## 2019-06-17 DIAGNOSIS — R200 Anesthesia of skin: Secondary | ICD-10-CM

## 2019-06-17 DIAGNOSIS — G35 Multiple sclerosis: Secondary | ICD-10-CM

## 2019-06-17 DIAGNOSIS — M79604 Pain in right leg: Secondary | ICD-10-CM

## 2019-06-17 DIAGNOSIS — G379 Demyelinating disease of central nervous system, unspecified: Secondary | ICD-10-CM

## 2019-06-17 DIAGNOSIS — R202 Paresthesia of skin: Secondary | ICD-10-CM

## 2019-06-17 DIAGNOSIS — R937 Abnormal findings on diagnostic imaging of other parts of musculoskeletal system: Secondary | ICD-10-CM

## 2019-06-17 MED ORDER — GADOBENATE DIMEGLUMINE 529 MG/ML IV SOLN
13.0000 mL | Freq: Once | INTRAVENOUS | Status: AC | PRN
  Administered 2019-06-17: 13 mL via INTRAVENOUS

## 2019-06-22 ENCOUNTER — Telehealth: Payer: Self-pay | Admitting: Neurology

## 2019-06-22 NOTE — Progress Notes (Signed)
Emma: Please call patient re: T spine MRI. Please also see if Dr. Epimenio Foot has additional input.  There were 2 small spots on the spinal cord at T6/7 and T10, neither one were picking up contrast, so are not active lesions but could represent prior MS lesions. He has some degenerative changes too and disc bulge at T8/9, not a herniation. I would recommend staying course with the medications as suggested by Dr. Epimenio Foot.

## 2019-06-22 NOTE — Telephone Encounter (Signed)
I spoke to Juan Foster who was recently diagnosed with MS.  We had discussed some treatment options and he was most interested in [place] and joining the enlighten study.  He will let our research staff know that he and his wife have been trying to conceive.  Because the drug is contraindicated in females and males trying to conceive, a different medication may be more appropriate.  I discussed this further with him.  He and his wife have been trying to conceive for the past 8 years and they have an appointment to see fertility this afternoon (has had other appointments in the past).  A thinking about in vitro fertilization and therefore freeze eggs and sperm or embryos.  Therefore, he thinks he may still be able to go on the medication.  I did discuss that I would like him to get started on a disease modifying therapy within the next month or so.  If everything is accomplished in the next few weeks we can continue to consider Zeposia (and the drug study).  Otherwise, I would recommend a different agent such as Vumerity or one of the monoclonal antibodies.

## 2019-07-14 ENCOUNTER — Other Ambulatory Visit: Payer: Self-pay | Admitting: Neurology

## 2019-07-14 DIAGNOSIS — G35 Multiple sclerosis: Secondary | ICD-10-CM

## 2020-05-04 ENCOUNTER — Other Ambulatory Visit: Payer: Self-pay | Admitting: Neurology

## 2020-05-04 DIAGNOSIS — G35 Multiple sclerosis: Secondary | ICD-10-CM

## 2020-05-10 ENCOUNTER — Ambulatory Visit
Admission: EM | Admit: 2020-05-10 | Discharge: 2020-05-10 | Disposition: A | Discharge status: Home / Self Care | Payer: 59 | Attending: Family Medicine | Admitting: Family Medicine

## 2020-05-10 ENCOUNTER — Other Ambulatory Visit: Payer: Self-pay

## 2020-05-10 DIAGNOSIS — B029 Zoster without complications: Secondary | ICD-10-CM

## 2020-05-10 DIAGNOSIS — R21 Rash and other nonspecific skin eruption: Secondary | ICD-10-CM | POA: Diagnosis not present

## 2020-05-10 MED ORDER — TRIAMCINOLONE ACETONIDE 0.1 % EX CREA: 1.0000 "application " | TOPICAL_CREAM | Freq: Two times a day (BID) | CUTANEOUS | 0 refills | Status: DC

## 2020-05-10 MED ORDER — VALACYCLOVIR HCL 1 G PO TABS: 1000.0000 mg | ORAL_TABLET | Freq: Three times a day (TID) | ORAL | 0 refills | Status: DC

## 2020-05-10 NOTE — ED Provider Notes (Signed)
[facility]   [id number] 05/10/20 Arrival Time: 0830  CC: RASH  SUBJECTIVE:  Juan Foster is a 40 y.o. male who presents with a skin complaint that began yesterday. Denies precipitating event or trauma. Denies changes in soaps, detergents, close contacts with similar rash, known trigger or environmental trigger, allergy. Started a new medication for MS and cannot recall the name of it, states that he is in a trial with this medication. Localizes the rash to L flank. Describes it as red, burning, itching with blisters. Denies any drainage from the area. Has not attempted OTC treatment. There are no aggravating or alleviating factors. Denies similar symptoms in the past. Denies fever, chills, nausea, vomiting, discharge, oral lesions, SOB, chest pain, abdominal pain, changes in bowel or bladder function.    ROS: As per HPI.  All other pertinent ROS negative.     Past Medical History:  Diagnosis Date  . History of chicken pox   . 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 Surgery to fuse lungs to cavity wall to prevent collapse  2006   No Known Allergies No current facility-administered medications on file prior to encounter.   Current Outpatient Medications on File Prior to Encounter  Medication Sig Dispense Refill  . Cholecalciferol (VITAMIN D) 50 MCG (2000 UT) CAPS Take 1 capsule (2,000 Units total) by mouth daily.    Marland Kitchen gabapentin (NEURONTIN) 300 MG capsule Take 1 capsule (300 mg total) by mouth 3 (three) times daily. 90 capsule 5  . loratadine (CLARITIN) 10 MG tablet Take 1 tablet (10 mg total) by mouth daily. (Patient taking differently: Take 10 mg by mouth daily as needed. )     Social History   Socioeconomic History  . Marital status: Married    Spouse name: Not on file  . Number of children: Not on file  . Years of education: Not on file  . Highest education level: Not on file   Occupational History  . Occupation: Adult nurse: [employer]  Tobacco Use  . Smoking status: Former Smoker    Quit date: 02/13/2011    Years since quitting: 9.2  . Smokeless tobacco: Never Used  Substance and Sexual Activity  . Alcohol use: Yes    Alcohol/week: 0.0 standard drinks    Comment: 1 bottle of wine/month  . Drug use: No  . Sexual activity: Not on file  Other Topics Concern  . Not on file  Social History Narrative   Exercises at least 3 times a week.   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 COPD Paternal Grandfather   . Sudden death Paternal Grandfather   . Prostate cancer Neg Hx   . Colon cancer Neg Hx     OBJECTIVE: Vitals:   05/10/20 0845  BP: 128/84  Pulse: 61  Resp: 18  Temp: (!) 97.5 F (36.4 C)  SpO2: 96%    General appearance: alert; no distress Head: NCAT Lungs: clear to auscultation bilaterally Heart: regular rate and rhythm.  Radial pulse 2+ bilaterally Extremities: no edema Skin: warm and dry; erythematous vesicular rash to L flank along dermatome, consistent with HZV Psychological: alert and cooperative; normal mood and affect  ASSESSMENT & PLAN:  1. Rash and nonspecific skin eruption   2. Herpes zoster without complication     Meds ordered this encounter  Medications  . valACYclovir (VALTREX) 1000 MG tablet    Sig: Take 1 tablet (1,000 mg total) by mouth 3 (three) times daily for 7 days.    Dispense:  21 tablet    Refill:  0     Order Specific Question:   Supervising Provider    Answer:   Juan Foster [id number]  . triamcinolone (KENALOG) 0.1 %    Sig: Apply 1 application topically 2 (two) times daily.    Dispense:  30 g    Refill:  0    Order Specific Question:   Supervising Provider    Answer:   Juan Foster [[id number]]   Prescribed Valtrex TID x 7 days Prescribed triamcinolone BID prn Take as prescribed and to completion Avoid hot showers/ baths Moisturize skin daily  Follow up with PCP if symptoms persists Return or go to the ER if you have any new or worsening symptoms such as fever, chills, nausea, vomiting, redness, swelling, discharge, if symptoms do not improve with medications  Reviewed expectations re: course of current medical issues. Questions answered. Outlined signs and symptoms indicating need for more acute intervention. Patient verbalized understanding. After Visit Summary given.   Juan Cipro, NP 05/10/20 [phone]

## 2020-05-10 NOTE — Discharge Instructions (Signed)
Prescribed Valtrex three times per day for 7 days  I have sent in triamcinolone cream for you to use to the area twice a day as needed.  Follow up with this office or with primary care if symptoms are persisting.  Follow up in the ER for high fever, trouble swallowing, trouble breathing, other concerning symptoms.

## 2020-05-10 NOTE — ED Triage Notes (Signed)
Pt presents with rash on left flank, believes to be shingles

## 2020-05-17 ENCOUNTER — Ambulatory Visit: Payer: 59 | Admitting: Family Medicine

## 2020-05-17 ENCOUNTER — Telehealth: Payer: Self-pay | Admitting: Family Medicine

## 2020-05-17 ENCOUNTER — Other Ambulatory Visit: Payer: Self-pay

## 2020-05-17 ENCOUNTER — Encounter: Payer: Self-pay | Admitting: Family Medicine

## 2020-05-17 DIAGNOSIS — B029 Zoster without complications: Secondary | ICD-10-CM | POA: Diagnosis not present

## 2020-05-17 DIAGNOSIS — E559 Vitamin D deficiency, unspecified: Secondary | ICD-10-CM

## 2020-05-17 MED ORDER — OZANIMOD HCL 0.92 MG PO CAPS: 1.0000 | ORAL_CAPSULE | Freq: Every day | ORAL | Status: DC

## 2020-05-17 MED ORDER — GABAPENTIN 100 MG PO CAPS: 100.0000 mg | ORAL_CAPSULE | Freq: Three times a day (TID) | ORAL | Status: DC | PRN

## 2020-05-17 MED ORDER — LIDOCAINE 5 % EX PTCH: 1.0000 | MEDICATED_PATCH | CUTANEOUS | 1 refills | Status: DC

## 2020-05-17 NOTE — Telephone Encounter (Signed)
PA started in covermymeds.com for Lidocaine 5% patches; Key: [id number].

## 2020-05-17 NOTE — Patient Instructions (Addendum)
Start using lidoderm patch.   Try using a lower dose of gabapentin.  If this isn't helping then let me know.  Take care.  Glad to see you.

## 2020-05-17 NOTE — Telephone Encounter (Signed)
Patient came in and stated medication for lidocaine 5% is needing a PA. Please advise.

## 2020-05-17 NOTE — Progress Notes (Signed)
This visit occurred during the SARS-CoV-2 public health emergency.  Safety protocols were in place, including screening questions prior to the visit, additional usage of staff PPE, and extensive cleaning of exam room while observing appropriate contact time as indicated for disinfecting solutions.  Follow up.  L dermatomal trunk rash and pain.  Burning pain.  Finishing valtrex today.  He has gabapentin on hand, prev used rarely, restarted recently.    His MS flare was better on ozanimod, had stopped gabapentin prev.    Taking gabapentin once in the day, then again 1-2 at night.  Prev blistering scabbed over.    He'll restart vitamin D, d/w pt.    Meds, vitals, and allergies reviewed.   ROS: Per HPI unless specifically indicated in ROS section   GEN: nad, alert and oriented HEENT: ncat NECK: supple w/o LA CV: rrr.  PULM: ctab, no inc wob ABD: soft, +bs EXT: no edema SKIN: Left trunk dermatomal rash with healing lesions noted, with dermatomal hypersensitivity in the same distribution.

## 2020-05-17 NOTE — Telephone Encounter (Signed)
Please start the PA.  Thanks.  He has shingles.

## 2020-05-18 DIAGNOSIS — B029 Zoster without complications: Secondary | ICD-10-CM | POA: Insufficient documentation

## 2020-05-18 NOTE — Assessment & Plan Note (Signed)
He is on a drug trial and he alerted the associated staff.  His rash is improving.  Done with Valtrex as of today.  Pathophysiology discussed with patient.  Reasonable to restart gabapentin at a lower dose, taking 100 to 200 mg 3 times a day.  He can take an extra 300 or 600 mg at night if needed.  The goal is to minimize drowsiness during the day.  Cautions given to patient.  He can try Lidoderm patch in the meantime and update me as needed.  He agrees to plan.

## 2020-05-18 NOTE — Telephone Encounter (Signed)
Previous PA was denied this am; I have resubmitted PA Key: [id number]. New PA was approved til 11/18/2020.

## 2020-05-18 NOTE — Assessment & Plan Note (Signed)
He will restart vitamin D.

## 2020-06-13 ENCOUNTER — Telehealth: Payer: Self-pay | Admitting: Neurology

## 2020-06-13 NOTE — Telephone Encounter (Signed)
He had shingles last month.  He reports that the pain is much better and the rash has resolved.  For while he was on gabapentin and Lidoderm.  I was asked by the enlighten study team to find out if he had a shingles vaccine in the past.  He has not had that.  He did have chickenpox as a child.

## 2020-07-17 ENCOUNTER — Other Ambulatory Visit: Payer: Self-pay | Admitting: Family Medicine

## 2020-07-17 DIAGNOSIS — G35 Multiple sclerosis: Secondary | ICD-10-CM

## 2020-07-17 DIAGNOSIS — E559 Vitamin D deficiency, unspecified: Secondary | ICD-10-CM

## 2020-07-18 ENCOUNTER — Other Ambulatory Visit: Payer: 59

## 2020-07-20 ENCOUNTER — Other Ambulatory Visit: Payer: Self-pay

## 2020-07-20 ENCOUNTER — Other Ambulatory Visit (INDEPENDENT_AMBULATORY_CARE_PROVIDER_SITE_OTHER): Payer: 59

## 2020-07-20 DIAGNOSIS — E559 Vitamin D deficiency, unspecified: Secondary | ICD-10-CM

## 2020-07-20 DIAGNOSIS — G35 Multiple sclerosis: Secondary | ICD-10-CM | POA: Diagnosis not present

## 2020-07-20 LAB — COMPREHENSIVE METABOLIC PANEL
ALT: 91 U/L — ABNORMAL HIGH (ref 0–53)
AST: 46 U/L — ABNORMAL HIGH (ref 0–37)
Albumin: 4.7 g/dL (ref 3.5–5.2)
Alkaline Phosphatase: 37 U/L — ABNORMAL LOW (ref 39–117)
BUN: 9 mg/dL (ref 6–23)
CO2: 29 mEq/L (ref 19–32)
Calcium: 9.3 mg/dL (ref 8.4–10.5)
Chloride: 102 mEq/L (ref 96–112)
Creatinine, Ser: 0.98 mg/dL (ref 0.40–1.50)
GFR: 96.19 mL/min (ref >60.00)
Glucose, Bld: 92 mg/dL (ref 70–99)
Potassium: 4.1 mEq/L (ref 3.5–5.1)
Sodium: 138 mEq/L (ref 135–145)
Total Bilirubin: 0.9 mg/dL (ref 0.2–1.2)
Total Protein: 7 g/dL (ref 6.0–8.3)

## 2020-07-20 LAB — CBC WITH DIFFERENTIAL/PLATELET
Basophils Absolute: 0 10*3/uL (ref 0.0–0.1)
Basophils Relative: 0.4 % (ref 0.0–3.0)
Eosinophils Absolute: 0.1 10*3/uL (ref 0.0–0.7)
Eosinophils Relative: 3.6 % (ref 0.0–5.0)
HCT: 39.8 % (ref 39.0–52.0)
Hemoglobin: 13.9 g/dL (ref 13.0–17.0)
Lymphocytes Relative: 12.5 % (ref 12.0–46.0)
Lymphs Abs: 0.3 10*3/uL — ABNORMAL LOW (ref 0.7–4.0)
MCHC: 34.8 g/dL (ref 30.0–36.0)
MCV: 93.8 fl (ref 78.0–100.0)
Monocytes Absolute: 0.6 10*3/uL (ref 0.1–1.0)
Monocytes Relative: 28.3 % — ABNORMAL HIGH (ref 3.0–12.0)
Neutro Abs: 1.2 10*3/uL — ABNORMAL LOW (ref 1.4–7.7)
Neutrophils Relative %: 55.2 % (ref 43.0–77.0)
Platelets: 144 10*3/uL — ABNORMAL LOW (ref 150.0–400.0)
RBC: 4.24 Mil/uL (ref 4.22–5.81)
RDW: 13.6 % (ref 11.5–15.5)
WBC: 2.2 10*3/uL — ABNORMAL LOW (ref 4.0–10.5)

## 2020-07-20 LAB — VITAMIN D 25 HYDROXY (VIT D DEFICIENCY, FRACTURES): VITD: 31 ng/mL (ref 30.00–100.00)

## 2020-07-25 ENCOUNTER — Telehealth: Payer: Self-pay | Admitting: Neurology

## 2020-07-25 ENCOUNTER — Encounter: Payer: 59 | Admitting: Family Medicine

## 2020-07-25 NOTE — Telephone Encounter (Signed)
Mr. Avino came in for a drug study visit.  Since the last visit he had shingles.  All of his symptoms resolved.  He is no longer on Lidoderm or gabapentin.  Several weeks ago he had a gastroenteritis for several days with nausea, vomiting and diarrhea.  Over the symptoms have resolved.  He denies any new neurologic symptoms.  EDSS today was 1.0 due to mild right-sided altered temperature sensation.  Vibration sensation was normal.  Position sensation was normal.

## 2020-07-28 ENCOUNTER — Other Ambulatory Visit: Payer: Self-pay

## 2020-07-28 ENCOUNTER — Ambulatory Visit (INDEPENDENT_AMBULATORY_CARE_PROVIDER_SITE_OTHER): Payer: 59 | Admitting: Family Medicine

## 2020-07-28 ENCOUNTER — Encounter: Payer: Self-pay | Admitting: Family Medicine

## 2020-07-28 DIAGNOSIS — G35 Multiple sclerosis: Secondary | ICD-10-CM

## 2020-07-28 DIAGNOSIS — Z029 Encounter for administrative examinations, unspecified: Secondary | ICD-10-CM

## 2020-07-28 DIAGNOSIS — Z7189 Other specified counseling: Secondary | ICD-10-CM | POA: Diagnosis not present

## 2020-07-28 NOTE — Progress Notes (Signed)
This visit occurred during the SARS-CoV-2 public health emergency.  Safety protocols were in place, including screening questions prior to the visit, additional usage of staff PPE, and extensive cleaning of exam room while observing appropriate contact time as indicated for disinfecting solutions.  Admin encounter for foster parenting.     Tetanus 2012 Flu yearly PNA and shingles not due.  covid vaccine prev done HIV testing prev done.  Colon and prostate cancer screening not due, d/w pt.  Living will d/w pt. Would have his wife designated if he were incapacitated.  Diet and exercise d/w pt.  His foster care situation is ongoing, with f/u hearings pending.  There is no contraindication to him being a foster parent, d/w pt.     History of MS.  He had f/u with neurology with labs done there.  D/w pt. Awaiting f/u labs.  He had concurrent illness with prev lab draw, unclear how much that affected his prev results.  He feels better in the meantime.  Still on ozanimod with rare use of gabapentin.  No numbness or tingling or weakness.  No abd pain, no jaundice.   PMH and SH reviewed Meds, vitals, and allergies reviewed.   ROS: Per HPI.  Unless specifically indicated otherwise in HPI, the patient denies:  General: fever. Eyes: acute vision changes ENT: sore throat Cardiovascular: chest pain Respiratory: SOB GI: vomiting GU: dysuria Musculoskeletal: acute back pain Derm: acute rash Neuro: acute motor dysfunction Psych: worsening mood Endocrine: polydipsia Heme: bleeding Allergy: hayfever  GEN: nad, alert and oriented HEENT: ncat NECK: supple w/o LA CV: rrr. PULM: ctab, no inc wob ABD: soft, +bs EXT: no edema SKIN: no acute rash

## 2020-07-28 NOTE — Patient Instructions (Addendum)
I'll work your forms as soon as I can.  Take care.  Glad to see you. Thanks for your effort.

## 2020-08-01 DIAGNOSIS — Z029 Encounter for administrative examinations, unspecified: Secondary | ICD-10-CM | POA: Insufficient documentation

## 2020-08-01 NOTE — Assessment & Plan Note (Signed)
Tetanus 2012 Flu yearly PNA and shingles not due.  covid vaccine prev done HIV testing prev done.  Colon and prostate cancer screening not due, d/w pt.  Living will d/w pt. Would have his wife designated if he were incapacitated.  Diet and exercise d/w pt.  His foster care situation is ongoing, with f/u hearings pending.  There is no contraindication to him being a foster parent, d/w pt.

## 2020-08-01 NOTE — Assessment & Plan Note (Signed)
Living will d/w pt. Would have his wife designated if he were incapacitated.

## 2020-08-01 NOTE — Assessment & Plan Note (Signed)
Per neurology.  Requesting copy of follow-up labs.

## 2020-08-02 ENCOUNTER — Encounter: Payer: Self-pay | Admitting: Family Medicine

## 2020-08-03 ENCOUNTER — Telehealth: Payer: Self-pay | Admitting: *Deleted

## 2020-08-03 NOTE — Telephone Encounter (Signed)
Request made for cd mri head.

## 2020-08-08 ENCOUNTER — Telehealth: Payer: Self-pay | Admitting: *Deleted

## 2020-08-08 NOTE — Telephone Encounter (Signed)
R/c cd from [facility]. Cd on [employer].

## 2020-08-26 LAB — ANA

## 2020-11-02 ENCOUNTER — Telehealth: Payer: Self-pay | Admitting: Neurology

## 2020-11-02 NOTE — Telephone Encounter (Signed)
Pt called requesting refill for Ozanimod HCl 0.92 MG CAPS. He states he only has 4 days left of his medication. Pt is requesting a call back [facility] [phone].

## 2021-05-04 ENCOUNTER — Other Ambulatory Visit: Payer: Self-pay | Admitting: Neurology

## 2021-05-04 DIAGNOSIS — G35 Multiple sclerosis: Secondary | ICD-10-CM

## 2021-05-18 ENCOUNTER — Encounter: Payer: Self-pay | Admitting: Family

## 2021-05-18 ENCOUNTER — Telehealth (INDEPENDENT_AMBULATORY_CARE_PROVIDER_SITE_OTHER): Payer: 59 | Admitting: Family

## 2021-05-18 ENCOUNTER — Other Ambulatory Visit: Payer: Self-pay

## 2021-05-18 VITALS — Temp 97.7°F | Ht 71.0 in | Wt 140.0 lb

## 2021-05-18 DIAGNOSIS — J0110 Acute frontal sinusitis, unspecified: Secondary | ICD-10-CM

## 2021-05-18 DIAGNOSIS — U071 COVID-19: Secondary | ICD-10-CM | POA: Diagnosis not present

## 2021-05-18 MED ORDER — MOLNUPIRAVIR EUA 200MG CAPSULE: 4.0000 | ORAL_CAPSULE | Freq: Two times a day (BID) | ORAL | 0 refills | Status: AC

## 2021-05-18 MED ORDER — AMOXICILLIN-POT CLAVULANATE 875-125 MG PO TABS: 1.0000 | ORAL_TABLET | Freq: Two times a day (BID) | ORAL | 0 refills | Status: DC

## 2021-05-18 NOTE — Assessment & Plan Note (Signed)
Choosing to treat for sinusitis as sinus symptoms > 10 days.  ?Also pt immunocompromised. ?

## 2021-05-18 NOTE — Assessment & Plan Note (Signed)
Sending antiviral as pt higher risk due to MS and medication to be immunocompromised. Advised of CDC guidelines for self isolation/ ending isolation.  Advised of safe practice guidelines. Symptom Tier reviewed.  Encouraged to monitor for any worsening symptoms; watch for increased shortness of breath, weakness, and signs of dehydration. Advised when to seek emergency care.  Instructed to rest and hydrate well.  Advised to leave the house during recommended isolation period, only if it is necessary to seek medical care ? ?

## 2021-05-18 NOTE — Progress Notes (Signed)
? ? ?MyChart Video Visit ? ? ? ?Virtual Visit via Video Note  ? ?This visit type was conducted due to national recommendations for restrictions regarding the COVID-19 Pandemic (e.g. social distancing) in an effort to limit this patient's exposure and mitigate transmission in our community. This patient is at least at moderate risk for complications without adequate follow up. This format is felt to be most appropriate for this patient at this time. Physical exam was limited by quality of the video and audio technology used for the visit. CMA was able to get the patient set up on a video visit. ? ?Patient location: Home. Patient and provider in visit ?Provider location: Office ? ?I discussed the limitations of evaluation and management by telemedicine and the availability of in person appointments. The patient expressed understanding and agreed to proceed. ? ?Visit Date: 05/18/2021 ? ?Today's healthcare provider: Mort Sawyers, FNP  ? ? ? ?Subjective:  ? ? Patient ID: Juan Foster, male    DOB: 02-14-1981, 41 y.o.   MRN: [id number] ? ?Chief Complaint  ?Patient presents with  ? Covid Positive  ?  Temp fluctuate, body ache, burning sensation lungs--4 days  ? ? ?HPI ? ?41 y/o male here via video visit with concerns. Tested positive for covid 05/17/21. ?Three days ago started with body aches and temperatures up to 101 F fluctuating on and off. Does feel chest congestion, burning sensation lungs, and without cough. ?Some sinus pressure on and off, pt states sinus pain and pressure x 2 weeks prior to even getting covid and not resolving. No sore throat no ear pain.  ? ?Taking tylenol for fever which is helping. ?Some mucinex which has been  helping a bit.  ? ?Pt is a high risk patient because he does have MS. ? ? ?Past Medical History:  ?Diagnosis Date  ? History of chicken pox   ? Lung collapse   ? x2 on L and x3 on R, s/p repair by CVTS 2006  ? MS (multiple sclerosis) (HCC)   ? ? ?Past Surgical History:  ?Procedure  Laterality Date  ? Surgery to fuse lungs to cavity wall to prevent collapse  2006  ? ? ?Family History  ?Problem Relation Age of Onset  ? COPD Paternal Grandfather   ? Sudden death Paternal Grandfather   ? Prostate cancer Neg Hx   ? Colon cancer Neg Hx   ? ? ?Social History  ? ?Socioeconomic History  ? Marital status: Married  ?  Spouse name: Not on file  ? Number of children: Not on file  ? Years of education: Not on file  ? Highest education level: Not on file  ?Occupational History  ? Occupation: Risk analyst  ?  Employer: [employer]  ?Tobacco Use  ? Smoking status: Former  ?  Types: Cigarettes  ?  Quit date: 02/13/2011  ?  Years since quitting: 10.2  ? Smokeless tobacco: Never  ?Substance and Sexual Activity  ? Alcohol use: Yes  ?  Alcohol/week: 0.0 standard drinks  ?  Comment: very rare wine  ? Drug use: No  ? Sexual activity: Not on file  ?Other Topics Concern  ? Not on file  ?Social History Narrative  ? Exercises at least 3 times a week.  ? Education:  [employer], [employer] of Programmer, systems then [employer]  ? From [place]  ? Married 2013  ? 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 Enjoys hiking, photography  ? 3 cups coffee per day  ?  Right handed   ? ?Social Determinants of Health  ? ?Financial Resource Strain: Not on file  ?Food Insecurity: Not on file  ?Transportation Needs: Not on file  ?Physical Activity: Not on file  ?Stress: Not on file  ?Social Connections: Not on file  ?Intimate Partner Violence: Not on file  ? ? ?Outpatient Medications Prior to Visit  ?Medication Sig Dispense Refill  ? gabapentin (NEURONTIN) 100 MG capsule Take 1-2 capsules (100-200 mg total) by mouth 3 (three) times daily as needed (with and extra 300-600 mg at night if needed.).    ? Ozanimod HCl 0.92 MG CAPS Take 1 capsule by mouth daily.    ? ?No facility-administered medications prior to visit.  ? ? ?No Known Allergies ? ?Review of Systems  ?Constitutional:  Positive for chills and fever.  ?HENT:  Positive for  congestion, sinus pain and sore throat (improving). Negative for ear pain.   ?Respiratory:  Negative for cough (but with chest congestion), sputum production (not coughing), shortness of breath and wheezing.   ?Cardiovascular:  Negative for chest pain.  ?All other systems reviewed and are negative. ? ?   ?Objective:  ?  ?Physical Exam ?Constitutional:   ?   General: He is not in acute distress. ?   Appearance: Normal appearance. He is normal weight. He is not ill-appearing, toxic-appearing or diaphoretic.  ?Pulmonary:  ?   Effort: Pulmonary effort is normal.  ?Neurological:  ?   General: No focal deficit present.  ?   Mental Status: He is alert and oriented to person, place, and time.  ?Psychiatric:     ?   Mood and Affect: Mood normal.     ?   Behavior: Behavior normal.     ?   Thought Content: Thought content normal.     ?   Judgment: Judgment normal.  ? ? ?Temp 97.7 ?F (36.5 ?C)   Ht  (1.803 m)   Wt 140 lb (63.5 kg)   BMI 19.53 kg/m?  ?Wt Readings from Last 3 Encounters:  ?05/18/21 140 lb (63.5 kg)  ?07/28/20 135 lb (61.2 kg)  ?05/17/20 134 lb (60.8 kg)  ? ? ?   ?Assessment & Plan:  ? ?Problem List Items Addressed This Visit   ? ?  ? Respiratory  ? Acute non-recurrent frontal sinusitis  ?  Choosing to treat for sinusitis as sinus symptoms > 10 days.  ?Also pt immunocompromised. ?  ?  ?  ? Other  ? COVID-19 - Primary  ?  Sending antiviral as pt higher risk due to MS and medication to be immunocompromised. Advised of CDC guidelines for self isolation/ ending isolation.  Advised of safe practice guidelines. Symptom Tier reviewed.  Encouraged to monitor for any worsening symptoms; watch for increased shortness of breath, weakness, and signs of dehydration. Advised when to seek emergency care.  Instructed to rest and hydrate well.  Advised to leave the house during recommended isolation period, only if it is necessary to seek medical care ? ?  ?  ? ? ?I am having Mickael C. Mudrick maintain his Ozanimod HCl and  gabapentin. ? ?No orders of the defined types were placed in this encounter. ? ? ?I discussed the assessment and treatment plan with the patient. The patient was provided an opportunity to ask questions and all were answered. The patient agreed with the plan and demonstrated an understanding of the instructions. ?  ?The patient was advised to call back or seek an in-person evaluation if  the symptoms worsen or if the condition fails to improve as anticipated. ? ?I provided 17 minutes of face-to-face time during this encounter. ? ? ?Mort Sawyers, FNP ?Nature conservation officer at [facility] ?[phone] (phone) ?[phone] (fax) ? ?[facility]  ?

## 2021-08-25 ENCOUNTER — Telehealth: Payer: Self-pay | Admitting: Neurology

## 2021-10-10 ENCOUNTER — Telehealth: Payer: Self-pay | Admitting: Family Medicine

## 2021-10-10 ENCOUNTER — Telehealth: Payer: Self-pay | Admitting: *Deleted

## 2021-10-10 ENCOUNTER — Ambulatory Visit: Payer: 59 | Admitting: Family

## 2021-10-10 ENCOUNTER — Encounter: Payer: Self-pay | Admitting: Family

## 2021-10-10 VITALS — BP 102/62 | HR 61 | Temp 98.3°F | Resp 16 | Ht 71.0 in | Wt 133.5 lb

## 2021-10-10 DIAGNOSIS — B027 Disseminated zoster: Secondary | ICD-10-CM | POA: Insufficient documentation

## 2021-10-10 MED ORDER — VALACYCLOVIR HCL 1 G PO TABS: 1000.0000 mg | ORAL_TABLET | Freq: Three times a day (TID) | ORAL | 0 refills | Status: DC

## 2021-10-10 NOTE — Telephone Encounter (Signed)
Patient was seen by Mort Sawyers today and was prescribed some medicine. Patient called in and stated that his insurance was recently changed and his pharmacy was changed. Patient would like for his prescription to go the following pharmacy [facility] [address], [place] - [address] for the following prescription valACYclovir (VALTREX) 1000 MG tablet.

## 2021-10-10 NOTE — Assessment & Plan Note (Signed)
rx valtrex 1000 mg tid for seven days  Please monitor site for worsening signs/symptoms of infection to include: increasing redness, increasing tenderness, increase in size, and or pustulant drainage from site. If this is to occur please let me know immediately.

## 2021-10-10 NOTE — Telephone Encounter (Signed)
Sent pt my chart.

## 2021-10-10 NOTE — Progress Notes (Signed)
Established Patient Office Visit  Subjective:  Patient ID: Juan Foster, male    DOB: 1980-09-14  Age: 41 y.o. MRN: [id number]  CC:  Chief Complaint  Patient presents with   Rash    Under hairline noticed yesterday itches and burns    HPI Juan Foster is here today with concerns.   Thinks he might have shingles. Started yesterday. Burning and tingling, when he touches it with some pain. No other symptoms     Past Medical History:  Diagnosis Date   History of chicken pox    Lung collapse    x2 on L and x3 on R, s/p repair by CVTS 2006   MS (multiple sclerosis) (HCC)     Past Surgical History:  Procedure Laterality Date   Surgery to fuse lungs to cavity wall to prevent collapse  2006    Family History  Problem Relation Age of Onset   COPD Paternal Grandfather    Sudden death Paternal Grandfather    Prostate cancer Neg Hx    Colon cancer Neg Hx     Social History   Socioeconomic History   Marital status: Married    Spouse name: Not on file   Number of children: Not on file   Years of education: Not on file   Highest education level: Not on file  Occupational History   Occupation: Adult nurse: [employer]  Tobacco Use   Smoking status: Former    Types: Cigarettes    Quit date: 02/13/2011    Years since quitting: 10.6   Smokeless tobacco: Never  Substance and Sexual Activity   Alcohol use: Yes    Alcohol/week: 0.0 standard drinks of alcohol    Comment: very rare wine   Drug use: No   Sexual activity: Not on file  Other Topics Concern   Not on file  Social History Narrative   Exercises at least 3 times a week.   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     Ozanimod HCl 0.92 MG CAPS Take 1 capsule by mouth daily.     amoxicillin-clavulanate (AUGMENTIN) 875-125 MG tablet Take 1 tablet by mouth 2 (two) times daily. (Patient not taking: Reported on 10/10/2021) 20 tablet 0   No facility-administered medications prior to visit.    No Known Allergies      Objective:    Physical Exam Constitutional:      General: He is not in acute distress.    Appearance: Normal appearance. He is normal weight. He is not ill-appearing, toxic-appearing or diaphoretic.  Pulmonary:     Effort: Pulmonary effort is normal.  Skin:    Findings: Rash (Right sided lower posterior scalp pain with vesicular  lesions. painful to touch) present.  Neurological:     General: No focal deficit present.     Mental Status: He is alert and oriented to person, place, and time. Mental status is at baseline.  Psychiatric:        Mood and Affect: Mood normal.        Behavior: Behavior normal.        Thought Content: Thought content normal.        Judgment: Judgment normal.     BP 102/62   Pulse 61   Temp 98.3 F (36.8 C)   Resp 16   Ht 5\' 11"  (1.803 m)   Wt 133 lb 8 oz (60.6 kg)   SpO2 97%   BMI 18.62 kg/m  Wt Readings from Last 3 Encounters:  10/10/21 133 lb 8 oz (60.6 kg)  05/18/21 140 lb (63.5 kg)  07/28/20 135 lb (61.2 kg)     Health Maintenance Due  Topic Date Due   Hepatitis C Screening  Never done   TETANUS/TDAP  07/09/2020   COVID-19 Vaccine (5 - Pfizer series) 09/11/2020   INFLUENZA VACCINE  10/03/2021    There are no preventive care reminders  to display for this patient.  Lab Results  Component Value Date   TSH 0.510 04/07/2019   Lab Results  Component Value Date   WBC 2.2 Repeated and verified X2. (L) 07/20/2020   HGB 13.9 07/20/2020   HCT 39.8 07/20/2020   MCV 93.8 07/20/2020   PLT 144.0 (L) 07/20/2020   Lab Results  Component Value Date   NA 138 07/20/2020   K 4.1 07/20/2020   CO2 29 07/20/2020   GLUCOSE 92 07/20/2020   BUN 9 07/20/2020   CREATININE 0.98 07/20/2020   BILITOT 0.9 07/20/2020   ALKPHOS 37 (L) 07/20/2020   AST 46 (H) 07/20/2020   ALT 91 (H) 07/20/2020   PROT 7.0 07/20/2020   ALBUMIN 4.7 07/20/2020   CALCIUM 9.3 07/20/2020   ANIONGAP 9 06/20/2014   GFR 96.19 07/20/2020   Lab Results  Component Value Date   HGBA1C 5.2 04/07/2019      Assessment & Plan:   Problem List Items Addressed This Visit       Other   Disseminated herpes zoster - Primary    rx valtrex 1000 mg tid for seven days  Please monitor site for worsening signs/symptoms of infection to include: increasing redness, increasing tenderness, increase in size, and or pustulant drainage from site. If this is to occur please let me know immediately.         Relevant Medications   valACYclovir (VALTREX) 1000 MG tablet    Meds ordered this encounter  Medications   valACYclovir (VALTREX) 1000 MG tablet    Sig: Take 1 tablet (1,000 mg total) by mouth 3 (three) times daily for 7 days.    Dispense:  21 tablet    Refill:  0    Order Specific Question:   Supervising Provider    Answer:   BEDSOLE, AMY E [[id number]]    Follow-up: No follow-ups on file.    06/05/2019, FNP

## 2021-10-11 ENCOUNTER — Telehealth: Payer: Self-pay | Admitting: Family Medicine

## 2021-10-11 ENCOUNTER — Encounter: Payer: Self-pay | Admitting: Family

## 2021-10-11 DIAGNOSIS — B027 Disseminated zoster: Secondary | ICD-10-CM

## 2021-10-11 MED ORDER — VALACYCLOVIR HCL 1 G PO TABS: 1000.0000 mg | ORAL_TABLET | Freq: Three times a day (TID) | ORAL | 0 refills | Status: AC

## 2021-10-11 NOTE — Addendum Note (Signed)
Addended by: Mort Sawyers on: 10/11/2021 12:11 PM   Modules accepted: Orders

## 2021-10-11 NOTE — Telephone Encounter (Signed)
Pt is needing his valACYclovir (VALTREX) 1000 MG tablet [[id number]] sent to [facility] Address: [address], [place], [place] [address] Phone: [phone]

## 2021-11-02 ENCOUNTER — Telehealth: Payer: Self-pay | Admitting: Neurology

## 2021-11-02 MED ORDER — GABAPENTIN 300 MG PO CAPS: 300.0000 mg | ORAL_CAPSULE | Freq: Three times a day (TID) | ORAL | 11 refills | Status: DC

## 2021-11-02 NOTE — Telephone Encounter (Signed)
Juan Foster is here today for an and [place] study visit.  He is tolerating ozanimod well and has no definite exacerbations.  He does report numbness and pain in the anterolateral right thigh in the distribution of the lateral femoral cutaneous nerve.  Of note, he had had the symptoms in the past year at the time of diagnosis but they had resolved.  The more recent dysesthesias started June 2023 and persisted though they fluctuate.  In the past, gabapentin had helped and we discussed restarting him on this medication.  He had shingles at an upper cervical dermatome.  Symptoms have resolved.  He has no new weakness, clumsiness, visual change or other neurologic symptoms outside of the right anterolateral thigh.   He will complete the rest of the study visit as planned with documentation in the study binder.     Diagnosis: Multiple sclerosis Lateral femoral cutaneous neuropathy   Plan Continue ozanimod Gabapentin 300 mg p.o. 3 times daily

## 2022-05-07 ENCOUNTER — Telehealth: Payer: Self-pay | Admitting: *Deleted

## 2022-05-07 NOTE — Telephone Encounter (Addendum)
Pt was in [place] drug study/ozanimod. He signed Henrietta Dine start form today with research and provided updated insurance cards.   Phone room- please call pt and offer to get him in for appt with Dr. Felecia Shelling 06/05/22 at 1:30pm. He will need to come in for updated visit with Dr. Felecia Shelling.   Faxed completed/signed Zeposia start form to [employer] at [phone]. Received fax confirmation.

## 2022-05-08 NOTE — Telephone Encounter (Signed)
Completed PA for Zeposia via [place]. Sent to [place]. Should have a determination within 3-5 business days. Key: [id number].

## 2022-05-10 NOTE — Telephone Encounter (Signed)
Called pt and informed him of message from nurse Terrence Dupont,  please call pt and offer to get him in for appt with Dr. Felecia Shelling 06/05/22 at 1:30pm. He will need to come in for updated visit with Dr. Felecia Shelling. Pt accepted appointment and was scheduled 06/05/2022 @ 1:30 pm. Pt thank me for calling.

## 2022-05-16 NOTE — Telephone Encounter (Signed)
I called patient to discuss if he has received his Zeposia from [facility].  No answer, left a voicemail asking him to call us back.  If patient calls back, please confirm that he received his shipment of Zeposia from [facility].

## 2022-05-16 NOTE — Telephone Encounter (Signed)
PA for Juan Foster was approved by [facility]: "Request Reference Number: [id number]. [place] CAP .'92MG'$  is approved through 05/08/2023. Your patient may now fill this prescription and it will be covered.. Authorization Expiration Date: May 08, 2023."

## 2022-05-16 NOTE — Telephone Encounter (Signed)
Patient's SP for Zeposia:

## 2022-05-21 ENCOUNTER — Other Ambulatory Visit: Payer: Self-pay | Admitting: Neurology

## 2022-05-21 DIAGNOSIS — G35 Multiple sclerosis: Secondary | ICD-10-CM

## 2022-06-05 ENCOUNTER — Encounter: Payer: Self-pay | Admitting: Neurology

## 2022-06-05 ENCOUNTER — Ambulatory Visit (INDEPENDENT_AMBULATORY_CARE_PROVIDER_SITE_OTHER): Payer: 59 | Admitting: Neurology

## 2022-06-05 VITALS — BP 131/80 | HR 57 | Ht 71.0 in | Wt 133.2 lb

## 2022-06-05 DIAGNOSIS — G8929 Other chronic pain: Secondary | ICD-10-CM | POA: Diagnosis not present

## 2022-06-05 DIAGNOSIS — M545 Low back pain, unspecified: Secondary | ICD-10-CM

## 2022-06-05 DIAGNOSIS — E559 Vitamin D deficiency, unspecified: Secondary | ICD-10-CM

## 2022-06-05 DIAGNOSIS — G35 Multiple sclerosis: Secondary | ICD-10-CM | POA: Diagnosis not present

## 2022-06-05 MED ORDER — CYCLOBENZAPRINE HCL 5 MG PO TABS: 5.0000 mg | ORAL_TABLET | Freq: Every day | ORAL | 5 refills | Status: DC

## 2022-06-05 NOTE — Telephone Encounter (Signed)
Pt came for office visit 06/05/22. He confirmed he has spoken with [employer]ep[employer] support. He needs to call back to confirm info. He is also aware he needs to call [employer] back to schedule shipment of medication. He has about 1.5 months left of drug study and confirmed he will call SP back.

## 2022-06-05 NOTE — Progress Notes (Signed)
[facility]  PATIENT: Juan Foster DOB: 09/21/1980  REFERRING DOCTOR OR PCP:     Elsie Stain, MD (PCP); referred by Star Age, MD PhD,  neurology SOURCE: Patient, notes from Dr. Rexene Alberts, imaging and laboratory reports, MRI images personally reviewed.  _________________________________   HISTORICAL  CHIEF COMPLAINT:  Chief Complaint  Patient presents with   Follow-up    RM 10, alone. Last seen 06/16/2019.  He has about 1.5 months left of drug study. He will call [facility] back to schedule shipment for Zeposia. Gets cold easily.    HISTORY OF PRESENT ILLNESS:  Juan Foster is a 42 y.o. man with relapsing remitting multiple sclerosis.   Update 06/05/2022: He is generally doing well clinically on Zeposia with no exacerbation or new neurologic symptom.   THe 2022 MRI showed 2 small new lesions compared to 2021,one enhanceing.  The 2023 MRi showed one very small new lesion (nonenhancing).  We had a discussion about the MRI.  We discussed that if the next MRI also shows that there has been changes, even if minimal, that I would recommend we consider a different disease modifying therapy.  He has tolerated the Zeposia well and would want to continue if an MRI is stable.  He denies any new neurologic symptoms though he does note occasional lower back pain.  MRI in the past had shown disc bulge at L5-S1...   He has old right arm numbness.  He feels his right leg is mildly weak and fatigue easy.   He can run and walk unlimited.    He notes the right hand will shake if he is doing a task requiring more task.   Bladder function is fine.   Vision is fine.  He notes mild fatigue and sleeps 5-6 hours most nights, feeling refreshed in the morning.  Sometimes he will have mild insomnia.  He denies mood or cognitive issues.  MS history: He had the onset of a dysesthetic pain in his right leg in November 2020.  Although symptoms change some they persisted and he had an MRI in  January 2021 that just showed minimal DJD.  He also had arm numbness. He was referred to Dr. Rexene Alberts who ordered MRI of the cervical spine which showed a subtle focus at [facility].    MRI of the brain showed multiple T2/FLAIR hyperintense foci in the hemispheres, some radially oriented to the ventricles.  2 foci enhance.  He entered at open labeled drug study for Zeposia in May 2021.  MRI in 2022 showed 2 small new lesions not present in 2021.  MRI in 2023 showed 1 small new lesion not present in 2022.  There were no new neurologic symptoms associated symptoms.  Data personally reviewed today: MRI lumbar spine 04/03/2019: There is minimal disc bulging at L5-S1.  The study is otherwise normal and there is no nerve root compression or spinal stenosis.  MRI of the cervical spine 04/28/2019 shows T2 hyperintense signal posterolaterally to the right adjacent to C4.  MRI of the brain 06/02/2019 shows multiple T2/FLAIR hyperintense foci in the periventricular, juxtacortical and deep white matter.  2 of the foci enhance after contrast.  No lesions are noted in the posterior fossa.  MRI of the brain 08/02/2020 showed 2 small new lesions not present in 2021 in the hemispheres.  One of the foci enhanced.  MRI of the head 08/03/2021 showed a single new focus in the left periatrial white matter not present in 2022.  It did not enhance.  REVIEW OF SYSTEMS: Constitutional: No fevers, chills, sweats, or change in appetite Eyes: No visual changes, double vision, eye pain Ear, nose and throat: No hearing loss, ear pain, nasal congestion, sore throat Cardiovascular: No chest pain, palpitations Respiratory:  No shortness of breath at rest or with exertion.   No wheezes GastrointestinaI: No nausea, vomiting, diarrhea, abdominal pain, fecal incontinence Genitourinary:  No dysuria, urinary retention or frequency.  No nocturia. Musculoskeletal:  No neck pain, back pain Integumentary: No rash, pruritus, skin lesions Neurological:  as above Psychiatric: No depression at this time.  No anxiety Endocrine: No palpitations, diaphoresis, change in appetite, change in weigh or increased thirst Hematologic/Lymphatic:  No anemia, purpura, petechiae. Allergic/Immunologic: No itchy/runny eyes, nasal congestion, recent allergic reactions, rashes  ALLERGIES: No Known Allergies  HOME MEDICATIONS:  Current Outpatient Medications:    cyclobenzaprine (FLEXERIL) 5 MG tablet, Take 1 tablet (5 mg total) by mouth at bedtime., Disp: 90 tablet, Rfl: 5   gabapentin (NEURONTIN) 300 MG capsule, Take 1 capsule (300 mg total) by mouth 3 (three) times daily. (Patient taking differently: Take 300 mg by mouth as needed.), Disp: 90 capsule, Rfl: 11   Ozanimod HCl 0.92 MG CAPS, Take 1 capsule by mouth daily., Disp: , Rfl:   PAST MEDICAL HISTORY: Past Medical History:  Diagnosis Date   History of chicken pox    Lung collapse    x2 on L and x3 on R, s/p repair by CVTS 2006   MS (multiple sclerosis)     PAST SURGICAL HISTORY: Past Surgical History:  Procedure Laterality Date   Surgery to fuse lungs to cavity wall to prevent collapse  2006    FAMILY HISTORY: Family History  Problem Relation Age of Onset   Diverticulitis Father    Melanoma Father    COPD Paternal Grandfather    Sudden death Paternal Grandfather    Prostate cancer Neg Hx    Colon cancer Neg Hx     SOCIAL HISTORY:  Social History   Socioeconomic History   Marital status: Married    Spouse name: Not on file   Number of children: Not on file   Years of education: Not on file   Highest education level: Bachelor's degree (e.g., BA, AB, BS)  Occupational History   Occupation: Psychiatrist: [employer]  Tobacco Use   Smoking status: Former    Types: Cigarettes    Quit date: 02/13/2011    Years since quitting: 11.3   Smokeless tobacco: Never  Substance and Sexual Activity   Alcohol use: Yes    Alcohol/week: 0.0 standard drinks of alcohol    Comment:  very rare wine   Drug use: No   Sexual activity: Not on file  Other Topics Concern   Not on file  Social History Narrative   Exercises at least 3 times a week.   Education:  [employer], [place]   Married 2013   Hydrologist- Scientist, water quality, Development worker, international aid   Enjoys hiking, photography   No coffee, 1 cup hot tea every morning   Right handed    Social Determinants of Radio broadcast assistant Strain: Not on file  Food Insecurity: Not on file  Transportation Needs: Not on file  Physical Activity: Not on file  Stress: Not on file  Social Connections: Not on file  Intimate Partner Violence: Not on file     PHYSICAL  EXAM  Vitals:   06/05/22 1309  BP: 131/80  Pulse: (!) 57  Weight: 133 lb 3.2 oz (60.4 kg)  Height: 5\' 11"  (1.803 m)    Body mass index is 18.58 kg/m.  No results found.    General: The patient is well-developed and well-nourished and in no acute distress  HEENT:  Head is [place]/AT.  Sclera are anicteric.     Skin: Extremities are without rash or  edema.  Musculoskeletal:  Back is nontender  Neurologic Exam  Mental status: The patient is alert and oriented x 3 at the time of the examination. The patient has apparent normal recent and remote memory, with an apparently normal attention span and concentration ability.   Speech is normal.  Cranial nerves: Extraocular movements are full. Pupils are equal, round, and reactive to light and accomodation.  Color vision is normal.  There is good facial sensation to soft touch bilaterally.Facial strength is normal.  Trapezius and sternocleidomastoid strength is normal. No dysarthria is noted.    No obvious hearing deficits are noted.  Motor:  Muscle bulk is normal.   Tone is normal. Strength is  5 / 5 in all 4 extremities.   Sensory: Sensory testing is intact to pinprick, soft touch and vibration sensation elswhere in limbs but reduced touch from axilla/adjacent  upper arm to +/- T6.  Coordination: Cerebellar testing reveals good finger-nose-finger and heel-to-shin bilaterally.  Gait and station: Station is normal.   Gait is normal. Tandem gait is normal. Romberg is negative.   Reflexes: Deep tendon reflexes are symmetric and normal bilaterally.       DIAGNOSTIC DATA (LABS, IMAGING, TESTING) - I reviewed patient records, labs, notes, testing and imaging myself where available.  Lab Results  Component Value Date   WBC 2.2 Repeated and verified X2. (L) 07/20/2020   HGB 13.9 07/20/2020   HCT 39.8 07/20/2020   MCV 93.8 07/20/2020   PLT 144.0 (L) 07/20/2020      Component Value Date/Time   NA 138 07/20/2020 0808   NA 135 04/07/2019 0921   NA 138 06/20/2014 1855   K 4.1 07/20/2020 0808   K 4.3 06/20/2014 1855   CL 102 07/20/2020 0808   CL 101 06/20/2014 1855   CO2 29 07/20/2020 0808   CO2 28 06/20/2014 1855   GLUCOSE 92 07/20/2020 0808   GLUCOSE 84 06/20/2014 1855   BUN 9 07/20/2020 0808   BUN 9 04/07/2019 0921   BUN 8 06/20/2014 1855   CREATININE 0.98 07/20/2020 0808   CREATININE 0.92 06/20/2014 1855   CALCIUM 9.3 07/20/2020 0808   CALCIUM 9.4 06/20/2014 1855   PROT 7.0 07/20/2020 0808   PROT 7.1 04/07/2019 0921   PROT 8.1 06/20/2014 1855   ALBUMIN 4.7 07/20/2020 0808   ALBUMIN 5.0 04/07/2019 0921   ALBUMIN 5.2 (H) 06/20/2014 1855   AST 46 (H) 07/20/2020 0808   AST 29 06/20/2014 1855   ALT 91 (H) 07/20/2020 0808   ALT 26 06/20/2014 1855   ALKPHOS 37 (L) 07/20/2020 0808   ALKPHOS 42 06/20/2014 1855   BILITOT 0.9 07/20/2020 0808   BILITOT 0.9 04/07/2019 0921   BILITOT 0.5 06/20/2014 1855   GFRNONAA 114 04/07/2019 0921   GFRNONAA >60 06/20/2014 1855   GFRAA 131 04/07/2019 0921   GFRAA >60 06/20/2014 1855   Lab Results  Component Value Date   CHOL 157 09/26/2016   HDL 56.20 09/26/2016   LDLCALC 85 09/26/2016   TRIG 82.0 09/26/2016   CHOLHDL 3  09/26/2016   Lab Results  Component Value Date   HGBA1C 5.2 04/07/2019    Lab Results  Component Value Date   VITAMINB12 282 04/07/2019   Lab Results  Component Value Date   TSH 0.510 04/07/2019       ASSESSMENT AND PLAN  Multiple sclerosis  Vitamin D deficiency - Plan: VITAMIN D 25 Hydroxy (Vit-D Deficiency, Fractures)  Chronic bilateral low back pain without sciatica  Continue Zeposia.  We will check an MRI next month.  If there are any new lesions, in light of the last MRI also showing a new focus, we would need to consider different disease modifying therapy.  If so, he would be most interested in an anti-CD20 agent. Flexeril 5 mg p.o. nightly Rtc 6 months or sooner if new or worsening symptoms.  Earnie Rockhold A. Felecia Shelling, MD, [facility] [id number], [id number] PM Certified in Neurology, Clinical Neurophysiology, Sleep Medicine and Neuroimaging  [facility] [address] [place], [place] [address] [phone])

## 2022-06-06 LAB — VITAMIN D 25 HYDROXY (VIT D DEFICIENCY, FRACTURES): Vit D, 25-Hydroxy: 21.2 ng/mL — ABNORMAL LOW (ref 30.0–100.0)

## 2022-07-04 NOTE — Telephone Encounter (Signed)
I called patient to confirm that he has received Hassie Bruce from [facility].  No answer, left a voicemail asking him to call us back.  If patient calls back please ask him if he has received his shipment of Zeposia from [employer].

## 2022-08-06 ENCOUNTER — Telehealth: Payer: Self-pay | Admitting: Neurology

## 2022-08-06 NOTE — Telephone Encounter (Signed)
Juan Foster came in today for his final treatment visit for the enlighten (ozanimod) drug study.  He has tolerated the medication well.  He has no new neurologic symptoms.  He actually denies any neurologic symptoms at this time.  EDSS was 0 25 foot timed walk 5.4 seconds 500 m walked in 5 minutes 23 seconds  He will complete the PRO's, cognitive tests, lab work and other study requirements today.  He already has the commercial drug And will start that tomorrow.  He has a safety visit at 30 and 90 days.   He has a regular visit with me in about 4 to 5 months

## 2022-09-07 ENCOUNTER — Encounter: Payer: Self-pay | Admitting: Neurology

## 2022-09-11 ENCOUNTER — Telehealth: Payer: Self-pay | Admitting: Neurology

## 2022-09-11 NOTE — Telephone Encounter (Signed)
Mr. Juan Foster comes in today for his 30-day safety visit.  He denies any new symptoms.  He is tolerating the commercial Zeposia (ozanimod) as well as the study drug.   Lab work at the last visit showed slightly elevated AST and total bili, but not enough to be concerned about.  The white blood cell count was reduced.  As expected, lymphocyte counts were reduced.  Differential did show a low number of atypical lymphs.  MRI 08/13/2022 did not show any new lesions.  He will complete the safety visit studies today

## 2022-09-18 ENCOUNTER — Encounter: Payer: Self-pay | Admitting: Family Medicine

## 2022-09-18 ENCOUNTER — Ambulatory Visit: Payer: 59 | Admitting: Family Medicine

## 2022-09-18 VITALS — BP 114/70 | HR 58 | Temp 97.6°F | Ht 71.0 in | Wt 132.4 lb

## 2022-09-18 DIAGNOSIS — W57XXXA Bitten or stung by nonvenomous insect and other nonvenomous arthropods, initial encounter: Secondary | ICD-10-CM | POA: Diagnosis not present

## 2022-09-18 DIAGNOSIS — S70362A Insect bite (nonvenomous), left thigh, initial encounter: Secondary | ICD-10-CM

## 2022-09-18 DIAGNOSIS — R21 Rash and other nonspecific skin eruption: Secondary | ICD-10-CM

## 2022-09-18 LAB — POCT SKIN KOH: Skin KOH, POC: NEGATIVE

## 2022-09-18 MED ORDER — DOXYCYCLINE HYCLATE 100 MG PO TABS: 100.0000 mg | ORAL_TABLET | Freq: Two times a day (BID) | ORAL | 0 refills | Status: DC

## 2022-09-18 NOTE — Progress Notes (Signed)
Ph: [phone] Fax: [phone]   Patient ID: Juan Foster, male    DOB: [birth year], 42 y.o.   MRN: [id number]  This visit was conducted in person.  BP 114/70   Pulse (!) 58   Temp 97.6 F (36.4 C) (Temporal)   Ht 5\' 11"  (1.803 m)   Wt 132 lb 6 oz (60 kg)   SpO2 99%   BMI 18.46 kg/m    CC: L groin rash after tick bite  Subjective:   HPI: Juan Foster is a 42 y.o. male presenting on 09/18/2022 for Rash (C/o rash in L groin due to tick bite. Noticed about 2 wks ago. )   Juan Foster  has a past medical history of History of chicken pox, Lung collapse, and MS (multiple sclerosis) (HCC). MS followed by Dr Epimenio Foot. He takes flexeril gabapentin and ozanimod.   Tick bite occurred 2 wks ago. Attached for <24 hours. 2-3d later area around bit got red and inflamed - itchy. Treating with OTC cortizone-10 cream with some benefit.   Some R lateral hip soreness, ?MS related.   No fevers/chills, headache, abd pain, nausea, other rash. No new fatigue.      Relevant past medical, surgical, family and social history reviewed and updated as indicated. Interim medical history since our last visit reviewed. Allergies and medications reviewed and updated. Outpatient Medications Prior to Visit  Medication Sig Dispense Refill   cyclobenzaprine (FLEXERIL) 5 MG tablet Take 1 tablet (5 mg total) by mouth at bedtime. 90 tablet 5   gabapentin (NEURONTIN) 300 MG capsule Take 1 capsule (300 mg total) by mouth 3 (three) times daily. (Patient taking differently: Take 300 mg by mouth as needed.) 90 capsule 11   Ozanimod HCl 0.92 MG CAPS Take 1 capsule by mouth daily. (Patient taking differently: Take 1 capsule by mouth daily. As needed)     No facility-administered medications prior to visit.     Per HPI unless specifically indicated in ROS section below Review of Systems  Objective:  BP 114/70   Pulse (!) 58   Temp 97.6 F (36.4 C) (Temporal)   Ht 5\' 11"  (1.803 m)   Wt 132 lb 6 oz (60 kg)    SpO2 99%   BMI 18.46 kg/m   Wt Readings from Last 3 Encounters:  09/18/22 132 lb 6 oz (60 kg)  06/05/22 133 lb 3.2 oz (60.4 kg)  10/10/21 133 lb 8 oz (60.6 kg)      Physical Exam Vitals and nursing note reviewed.  Constitutional:      Appearance: Normal appearance. He is not ill-appearing.  Skin:    Findings: Erythema and rash present.     Comments: Annular scaly erythematous rash surrounding tick bite with rolling borders. Smaller similar patch proximal to initial rash   Neurological:     Mental Status: He is alert.  Psychiatric:        Mood and Affect: Mood normal.        Behavior: Behavior normal.       Results for orders placed or performed in visit on 09/18/22  POCT Skin KOH  Result Value Ref Range   Skin KOH, POC Negative Negative    Assessment & Plan:   Problem List Items Addressed This Visit     Skin rash - Primary    Localized skin rash after tick bite, not consistent with erythema migrans or RMSF. Suspect possible tinea corporis but KOH skin scrapings not definitive for ringworm.  Will Rx  doxy 7d course as well as regardless start OTC lotrimin x 2 wks.  Update if not improved with this.  Red flag symptoms to seek further care reviewed.       Relevant Orders   POCT Skin KOH (Completed)   Other Visit Diagnoses     Tick bite of left thigh, initial encounter            Meds ordered this encounter  Medications   doxycycline (VIBRA-TABS) 100 MG tablet    Sig: Take 1 tablet (100 mg total) by mouth 2 (two) times daily.    Dispense:  14 tablet    Refill:  0    Orders Placed This Encounter  Procedures   POCT Skin KOH    Patient Instructions  Possible ringworm, possible rash after tick bite Take doxycycline 7d course, as well as start over the counter lotrimin antifungal cream twice daily.  Let us know if not improving with treatment.   Follow up plan: Return if symptoms worsen or fail to improve.  [name], MD

## 2022-09-18 NOTE — Assessment & Plan Note (Addendum)
Localized skin rash after tick bite, not consistent with erythema migrans or RMSF. Suspect possible tinea corporis but KOH skin scrapings not definitive for ringworm.  Will Rx doxy 7d course as well as regardless start OTC lotrimin x 2 wks.  Update if not improved with this.  Red flag symptoms to seek further care reviewed.

## 2022-09-18 NOTE — Patient Instructions (Signed)
Possible ringworm, possible rash after tick bite Take doxycycline 7d course, as well as start over the counter lotrimin antifungal cream twice daily.  Let us know if not improving with treatment.

## 2022-10-03 ENCOUNTER — Encounter (INDEPENDENT_AMBULATORY_CARE_PROVIDER_SITE_OTHER): Payer: Self-pay

## 2022-10-21 ENCOUNTER — Other Ambulatory Visit: Payer: Self-pay | Admitting: Family Medicine

## 2022-10-21 DIAGNOSIS — Z13220 Encounter for screening for lipoid disorders: Secondary | ICD-10-CM

## 2022-10-21 DIAGNOSIS — G35 Multiple sclerosis: Secondary | ICD-10-CM

## 2022-10-21 DIAGNOSIS — E559 Vitamin D deficiency, unspecified: Secondary | ICD-10-CM

## 2022-10-22 ENCOUNTER — Other Ambulatory Visit (INDEPENDENT_AMBULATORY_CARE_PROVIDER_SITE_OTHER): Payer: 59

## 2022-10-22 DIAGNOSIS — Z13220 Encounter for screening for lipoid disorders: Secondary | ICD-10-CM | POA: Diagnosis not present

## 2022-10-22 DIAGNOSIS — E559 Vitamin D deficiency, unspecified: Secondary | ICD-10-CM

## 2022-10-22 DIAGNOSIS — G35 Multiple sclerosis: Secondary | ICD-10-CM | POA: Diagnosis not present

## 2022-10-22 LAB — CBC WITH DIFFERENTIAL/PLATELET
Basophils Absolute: 0 10*3/uL (ref 0.0–0.1)
Basophils Relative: 0.6 % (ref 0.0–3.0)
Eosinophils Absolute: 0.1 10*3/uL (ref 0.0–0.7)
Eosinophils Relative: 3.1 % (ref 0.0–5.0)
HCT: 41.8 % (ref 39.0–52.0)
Hemoglobin: 13.8 g/dL (ref 13.0–17.0)
Lymphocytes Relative: 12.2 % (ref 12.0–46.0)
Lymphs Abs: 0.3 10*3/uL — ABNORMAL LOW (ref 0.7–4.0)
MCHC: 33.1 g/dL (ref 30.0–36.0)
MCV: 94.7 fl (ref 78.0–100.0)
Monocytes Absolute: 0.5 10*3/uL (ref 0.1–1.0)
Monocytes Relative: 16.3 % — ABNORMAL HIGH (ref 3.0–12.0)
Neutro Abs: 1.9 10*3/uL (ref 1.4–7.7)
Neutrophils Relative %: 67.8 % (ref 43.0–77.0)
Platelets: 148 10*3/uL — ABNORMAL LOW (ref 150.0–400.0)
RBC: 4.41 Mil/uL (ref 4.22–5.81)
RDW: 13.4 % (ref 11.5–15.5)
WBC: 2.8 10*3/uL — ABNORMAL LOW (ref 4.0–10.5)

## 2022-10-22 LAB — COMPREHENSIVE METABOLIC PANEL
ALT: 50 U/L (ref 0–53)
AST: 35 U/L (ref 0–37)
Albumin: 4.3 g/dL (ref 3.5–5.2)
Alkaline Phosphatase: 37 U/L — ABNORMAL LOW (ref 39–117)
BUN: 9 mg/dL (ref 6–23)
CO2: 29 mEq/L (ref 19–32)
Calcium: 9.3 mg/dL (ref 8.4–10.5)
Chloride: 103 mEq/L (ref 96–112)
Creatinine, Ser: 1.01 mg/dL (ref 0.40–1.50)
GFR: 91.32 mL/min (ref >60.00)
Glucose, Bld: 93 mg/dL (ref 70–99)
Potassium: 3.9 mEq/L (ref 3.5–5.1)
Sodium: 140 mEq/L (ref 135–145)
Total Bilirubin: 1.1 mg/dL (ref 0.2–1.2)
Total Protein: 6.7 g/dL (ref 6.0–8.3)

## 2022-10-22 LAB — LIPID PANEL
Cholesterol: 198 mg/dL (ref 0–200)
HDL: 59.5 mg/dL (ref >39.00)
LDL Cholesterol: 126 mg/dL — ABNORMAL HIGH (ref 0–99)
NonHDL: 138.64
Total CHOL/HDL Ratio: 3
Triglycerides: 62 mg/dL (ref 0.0–149.0)
VLDL: 12.4 mg/dL (ref 0.0–40.0)

## 2022-10-22 LAB — VITAMIN D 25 HYDROXY (VIT D DEFICIENCY, FRACTURES): VITD: 35.45 ng/mL (ref 30.00–100.00)

## 2022-10-29 ENCOUNTER — Encounter: Payer: Self-pay | Admitting: Family Medicine

## 2022-10-29 ENCOUNTER — Ambulatory Visit (INDEPENDENT_AMBULATORY_CARE_PROVIDER_SITE_OTHER): Payer: 59 | Admitting: Family Medicine

## 2022-10-29 VITALS — BP 112/64 | HR 64 | Temp 98.1°F | Ht 71.0 in | Wt 134.0 lb

## 2022-10-29 DIAGNOSIS — Z0000 Encounter for general adult medical examination without abnormal findings: Secondary | ICD-10-CM

## 2022-10-29 DIAGNOSIS — R21 Rash and other nonspecific skin eruption: Secondary | ICD-10-CM

## 2022-10-29 DIAGNOSIS — G35 Multiple sclerosis: Secondary | ICD-10-CM

## 2022-10-29 DIAGNOSIS — E559 Vitamin D deficiency, unspecified: Secondary | ICD-10-CM

## 2022-10-29 DIAGNOSIS — Z7189 Other specified counseling: Secondary | ICD-10-CM

## 2022-10-29 MED ORDER — VALACYCLOVIR HCL 1 G PO TABS: 1000.0000 mg | ORAL_TABLET | Freq: Three times a day (TID) | ORAL | 0 refills | Status: AC

## 2022-10-29 MED ORDER — GABAPENTIN 300 MG PO CAPS: 300.0000 mg | ORAL_CAPSULE | Freq: Every day | ORAL | Status: AC | PRN

## 2022-10-29 MED ORDER — CYCLOBENZAPRINE HCL 5 MG PO TABS: 5.0000 mg | ORAL_TABLET | Freq: Every day | ORAL | Status: DC | PRN

## 2022-10-29 NOTE — Progress Notes (Unsigned)
CPE- See plan.  Routine anticipatory guidance given to patient.  See health maintenance.  The possibility exists that previously documented standard health maintenance information may have been brought forward from a previous encounter into this note.  If needed, that same information has been updated to reflect the current situation based on today's encounter.    Tetanus 2012, d/w pt.  Flu yearly PNA and shingles not due.   covid vaccine prev done HIV testing prev done.  Colon and prostate cancer screening not due, d/w pt.  Living will d/w pt.  Would have his wife designated if he were incapacitated.  Diet and exercise d/w pt.  H/o shingles x2, d/w pt about having valtrex rx to hold.  Rx printed.   Prev rash is improving.   MS per neuro.  No new sx.  I'll defer to patient and neuro, he agrees.    Labs d/w pt.   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  No h/o any issue that would prevent him from fostering.  He can send me his paperwork in the future if needed.    PMH and SH reviewed  Meds, vitals, and allergies reviewed.   ROS: Per HPI.  Unless specifically indicated otherwise in HPI, the patient denies:  General: fever. Eyes: acute vision changes ENT: sore throat Cardiovascular: chest pain Respiratory: SOB GI: vomiting GU: dysuria Musculoskeletal: acute back pain Derm: acute rash Neuro: acute motor dysfunction Psych: worsening mood Endocrine: polydipsia Heme: bleeding Allergy: hayfever  GEN: nad, alert and oriented HEENT: mucous membranes moist NECK: supple w/o LA CV: rrr. PULM: ctab, no inc wob ABD: soft, +bs EXT: no edema SKIN: minimal residual blanching reddish macule on the L medial thigh but clearly smaller than prior per patient report.   The 10-year ASCVD risk score (Arnett DK, et al., 2019) is: 1%   Values used to calculate the score:     Age: 42 years     Sex: Male     Is Non-Hispanic African American: No     Diabetic: No     Tobacco smoker: No      Systolic Blood Pressure: 112 mmHg     Is BP treated: No     HDL Cholesterol: 59.5 mg/dL     Total Cholesterol: 198 mg/dL

## 2022-10-29 NOTE — Patient Instructions (Signed)
I would get a tetanus (Tdap) shot.  I would get a flu shot each fall.   Take care.  Glad to see you.

## 2022-10-31 ENCOUNTER — Encounter: Payer: Self-pay | Admitting: Family Medicine

## 2022-10-31 NOTE — Assessment & Plan Note (Signed)
Resolved

## 2022-10-31 NOTE — Assessment & Plan Note (Signed)
  Tetanus 2012, d/w pt.  Flu yearly PNA and shingles not due.   covid vaccine prev done HIV testing prev done.  Colon and prostate cancer screening not due, d/w pt.  Living will d/w pt.  Would have his wife designated if he were incapacitated.  Diet and exercise d/w pt.

## 2022-10-31 NOTE — Assessment & Plan Note (Signed)
Per neuro.  I'll defer.  He agrees.

## 2022-10-31 NOTE — Assessment & Plan Note (Signed)
Living will d/w pt. Would have his wife designated if he were incapacitated.  

## 2022-10-31 NOTE — Assessment & Plan Note (Signed)
Improved, can update me as needed.

## 2022-11-01 ENCOUNTER — Telehealth: Payer: Self-pay | Admitting: Family Medicine

## 2022-11-01 NOTE — Telephone Encounter (Signed)
Forms placed in provider's folder 

## 2022-11-01 NOTE — Telephone Encounter (Signed)
Patient came in and dropped off a Medical Evaluation form to be filled out by dr. Para March ,patient would like a call back once completed for pick up

## 2022-11-06 NOTE — Telephone Encounter (Signed)
Form done and placed up front for pickup. Patient has been notified.

## 2022-11-08 ENCOUNTER — Telehealth: Payer: Self-pay | Admitting: Neurology

## 2022-11-08 NOTE — Telephone Encounter (Signed)
Juan Foster came in today for his final study visit (safety visit) for the Enlighten (ozanimod) drug study.  He has tolerated the medication well.  He has no new neurologic symptoms.  He denies any neurologic symptoms at this time.   EDSS was 0  Physical and neuro exam were normal  Labs were drawn  He is now on commercial drug and tolerates it well.   He has a f/u visit with me in 2 months

## 2022-11-27 ENCOUNTER — Encounter: Payer: Self-pay | Admitting: Family Medicine

## 2022-12-17 ENCOUNTER — Ambulatory Visit (INDEPENDENT_AMBULATORY_CARE_PROVIDER_SITE_OTHER): Payer: 59 | Admitting: Neurology

## 2022-12-17 ENCOUNTER — Encounter: Payer: Self-pay | Admitting: Neurology

## 2022-12-17 VITALS — BP 104/55 | HR 51 | Ht 72.0 in | Wt 132.4 lb

## 2022-12-17 DIAGNOSIS — G35 Multiple sclerosis: Secondary | ICD-10-CM

## 2022-12-17 DIAGNOSIS — E559 Vitamin D deficiency, unspecified: Secondary | ICD-10-CM | POA: Diagnosis not present

## 2022-12-17 DIAGNOSIS — G8929 Other chronic pain: Secondary | ICD-10-CM

## 2022-12-17 DIAGNOSIS — M545 Low back pain, unspecified: Secondary | ICD-10-CM

## 2022-12-17 NOTE — Progress Notes (Signed)
[facility]  PATIENT: Juan Foster DOB: 1980-06-12  REFERRING DOCTOR OR PCP:     Crawford Givens, MD (PCP); referred by Huston Foley, MD PhD,  neurology SOURCE: Patient, notes from Dr. Frances Furbish, imaging and laboratory reports, MRI images personally reviewed.  _________________________________   HISTORICAL  CHIEF COMPLAINT:  Chief Complaint  Patient presents with   Follow-up    Pt in room 10. Here for MS follow up. Finished [facility]en (ozanimod) drug study. Pt reports doing well. No concerns.    HISTORY OF PRESENT ILLNESS:  Mr. Juan Foster is a 42 y.o. man with relapsing remitting multiple sclerosis.   Update 12/17/2022 He is generally doing well clinically on Zeposia with no exacerbation or new neurologic symptom.   THe 2022 MRI showed 2 small new lesions compared to 2021,one enhanceing.  The 2023 MRi showed one very small new lesion (nonenhancing).  We had a discussion about the MRI.  We discussed that if the next MRI also shows that there has been changes, even if minimal, that I would recommend we consider a different disease modifying therapy.     He denies any new neurologic symptoms.   He can run and walk unlimited.    He notes the right hand will shake if he is doing a task requiring more task.    He has old right arm numbness.  He feels his right leg is mildly weak and fatigue easy.     Bladder function is fine.   Vision is fine.  He has occasional lower back pain.  Pain is axial only.   MRI in the past had shown disc bulge at L5-S1.    He notes mild fatigue and sleeps 5-6 hours most nights, feeling refreshed in the morning.  Sometimes he will have mild insomnia.  He denies mood or cognitive issues.   He has a 43 year old and 3 kids under age two in te house (does foster kids)  MS history: He had the onset of a dysesthetic pain in his right leg in November 2020.  Although symptoms change some they persisted and he had an MRI in January 2021 that just showed  minimal DJD.  He also had arm numbness. He was referred to Dr. Frances Furbish who ordered MRI of the cervical spine which showed a subtle focus at [facility].    MRI of the brain showed multiple T2/FLAIR hyperintense foci in the hemispheres, some radially oriented to the ventricles.  2 foci enhance.  He entered at open labeled drug study for Zeposia in May 2021.  MRI in 2022 showed 2 small new lesions not present in 2021.  MRI in 2023 showed 1 small new lesion not present in 2022.  There were no new neurologic symptoms associated symptoms.   MRI in 2023 and 2024 showed no further progression.    Data personally reviewed: MRI lumbar spine 04/03/2019: There is minimal disc bulging at L5-S1.  The study is otherwise normal and there is no nerve root compression or spinal stenosis.  MRI of the cervical spine 04/28/2019 shows T2 hyperintense signal posterolaterally to the right adjacent to C4.  MRI of the brain 06/02/2019 shows multiple T2/FLAIR hyperintense foci in the periventricular, juxtacortical and deep white matter.  2 of the foci enhance after contrast.  No lesions are noted in the posterior fossa.  MRI of the brain 08/02/2020 showed 2 small new lesions not present in 2021 in the hemispheres.  One of the foci enhanced.  MRI of the  head 08/03/2021 showed a single new focus in the left periatrial white matter not present in 2022.  It did not enhance.  REVIEW OF SYSTEMS: Constitutional: No fevers, chills, sweats, or change in appetite Eyes: No visual changes, double vision, eye pain Ear, nose and throat: No hearing loss, ear pain, nasal congestion, sore throat Cardiovascular: No chest pain, palpitations Respiratory:  No shortness of breath at rest or with exertion.   No wheezes GastrointestinaI: No nausea, vomiting, diarrhea, abdominal pain, fecal incontinence Genitourinary:  No dysuria, urinary retention or frequency.  No nocturia. Musculoskeletal:  No neck pain, back pain Integumentary: No rash, pruritus, skin  lesions Neurological: as above Psychiatric: No depression at this time.  No anxiety Endocrine: No palpitations, diaphoresis, change in appetite, change in weigh or increased thirst Hematologic/Lymphatic:  No anemia, purpura, petechiae. Allergic/Immunologic: No itchy/runny eyes, nasal congestion, recent allergic reactions, rashes  ALLERGIES: No Known Allergies  HOME MEDICATIONS:  Current Outpatient Medications:    cyclobenzaprine (FLEXERIL) 5 MG tablet, Take 1 tablet (5 mg total) by mouth daily as needed., Disp: , Rfl:    gabapentin (NEURONTIN) 300 MG capsule, Take 1 capsule (300 mg total) by mouth daily as needed., Disp: , Rfl:    Ozanimod HCl 0.92 MG CAPS, Take 1 capsule by mouth daily., Disp: , Rfl:    valACYclovir (VALTREX) 1000 MG tablet, Take 1 tablet (1,000 mg total) by mouth 3 (three) times daily., Disp: 21 tablet, Rfl: 0  PAST MEDICAL HISTORY: Past Medical History:  Diagnosis Date   History of chicken pox    Lung collapse    x2 on L and x3 on R, s/p repair by CVTS 2006   MS (multiple sclerosis) (HCC)     PAST SURGICAL HISTORY: Past Surgical History:  Procedure Laterality Date   Surgery to fuse lungs to cavity wall to prevent collapse  2006    FAMILY HISTORY: Family History  Problem Relation Age of Onset   Diverticulitis Father    Melanoma Father    COPD Paternal Grandfather    Sudden death Paternal Grandfather    Prostate cancer Neg Hx    Colon cancer Neg Hx     SOCIAL HISTORY:  Social History   Socioeconomic History   Marital status: Married    Spouse name: Not on file   Number of children: Not on file   Years of education: Not on file   Highest education level: Bachelor's degree (e.g., BA, AB, BS)  Occupational History   Occupation: Adult nurse: [employer]  Tobacco Use   Smoking status: Former    Current packs/day: 0.00    Types: Cigarettes    Quit date: 02/13/2011    Years since quitting: 11.8   Smokeless tobacco: Never  Substance  and Sexual Activity   Alcohol use: Yes    Alcohol/week: 0.0 standard drinks of alcohol    Comment: very rare wine   Drug use: No   Sexual activity: Not on file  Other Topics Concern   Not on file  Social History Narrative   Exercises at least 3 times a week.   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    Has guardianship for [facility].  Social Determinants of Health   Financial Resource Strain: Not on file  Food Insecurity: Not on file  Transportation Needs: Not on file  Physical Activity: Not on file  Stress: Not on file  Social Connections: Unknown (07/04/2021)   Received [facility], [facility]   Social Network    Social Network: Not on file  Intimate Partner Violence: Unknown (06/05/2021)   Received from [facility], [facility]   HITS    Physically Hurt: Not on file    Insult or Talk Down To: Not on file    Threaten Physical Harm: Not on file    Scream or Curse: Not on file     PHYSICAL EXAM  Vitals:   12/17/22 0835  BP: (!) 104/55  Pulse: (!) 51  Weight: 132 lb 6.4 oz (60.1 kg)  Height: 6' (1.829 m)    Body mass index is 17.96 kg/m.  No results found.    General: The patient is well-developed and well-nourished and in no acute distress  HEENT:  Head is [place]/AT.  Sclera are anicteric.     Skin: Extremities are without rash or  edema.  Musculoskeletal:  Back is nontender  Neurologic Exam  Mental status: The patient is alert and oriented x 3 at the time of the examination. The patient has apparent normal recent and remote memory, with an apparently normal attention span and concentration ability.   Speech is normal.  Cranial nerves: Extraocular movements are full.Facial strnegth and sensation  are normal.  Trapezius and sternocleidomastoid strength is normal. No dysarthria is noted.    No obvious hearing deficits are noted.  Motor:  Muscle bulk is normal.   Tone is normal. Strength is  5 / 5 in all 4 extremities.   Sensory: Sensory testing is intact to pinprick, soft touch and vibration sensation elswhere in limbs but reduced touch from axilla/adjacent upper arm to +/- T6.  Coordination: Cerebellar testing reveals good finger-nose-finger and heel-to-shin bilaterally.  Gait and station: Station is normal.  Noemal gait and tandem gait.  Romberg is negative.   Reflexes: Deep tendon reflexes are symmetric and normal bilaterally.       DIAGNOSTIC DATA (LABS, IMAGING, TESTING) - I reviewed patient records, labs, notes, testing and imaging myself where available.  Lab Results  Component Value Date   WBC 2.8 (L) 10/22/2022   HGB 13.8 10/22/2022   HCT 41.8 10/22/2022   MCV 94.7 10/22/2022   PLT 148.0 (L) 10/22/2022      Component Value Date/Time   NA 140 10/22/2022 0807   NA 135 04/07/2019 0921   NA 138 06/20/2014 1855   K 3.9 10/22/2022 0807   K 4.3 06/20/2014 1855   CL 103 10/22/2022 0807   CL 101 06/20/2014 1855   CO2 29 10/22/2022 0807   CO2 28 06/20/2014 1855   GLUCOSE 93 10/22/2022 0807   GLUCOSE 84 06/20/2014 1855   BUN 9 10/22/2022 0807   BUN 9 04/07/2019 0921   BUN 8 06/20/2014 1855   CREATININE 1.01 10/22/2022 0807   CREATININE 0.92 06/20/2014 1855   CALCIUM 9.3 10/22/2022 0807   CALCIUM 9.4 06/20/2014 1855   PROT 6.7 10/22/2022 0807   PROT 7.1 04/07/2019 0921   PROT 8.1 06/20/2014 1855   ALBUMIN 4.3 10/22/2022 0807   ALBUMIN 5.0 04/07/2019 0921   ALBUMIN 5.2 (H) 06/20/2014 1855   AST 35 10/22/2022 0807   AST 29 06/20/2014 1855   ALT 50 10/22/2022 0807   ALT 26 06/20/2014  1855   ALKPHOS 37 (L) 10/22/2022 0807   ALKPHOS 42 06/20/2014 1855   BILITOT 1.1 10/22/2022 0807   BILITOT 0.9 04/07/2019 0921   BILITOT 0.5 06/20/2014 1855   GFRNONAA 114  04/07/2019 0921   GFRNONAA >60 06/20/2014 1855   GFRAA 131 04/07/2019 0921   GFRAA >60 06/20/2014 1855   Lab Results  Component Value Date   CHOL 198 10/22/2022   HDL 59.50 10/22/2022   LDLCALC 126 (H) 10/22/2022   TRIG 62.0 10/22/2022   CHOLHDL 3 10/22/2022   Lab Results  Component Value Date   HGBA1C 5.2 04/07/2019   Lab Results  Component Value Date   VITAMINB12 282 04/07/2019   Lab Results  Component Value Date   TSH 0.510 04/07/2019       ASSESSMENT AND PLAN  Multiple sclerosis (HCC)  Vitamin D deficiency  Chronic bilateral low back pain without sciatica  Continue Zeposia.  Labs were ok recently (August 2024 lymphocyte count is 0.3).   MRI next year Flexeril 5 mg p.o. prn nightly for LBP Rtc 6 months or sooner if new or worsening symptoms.  Dniyah Grant A. Epimenio Foot, MD, [facility] 12/17/2022, 9:04 AM Certified in Neurology, Clinical Neurophysiology, Sleep Medicine and Neuroimaging  [facility] [address] [place], [place] [address] [phone])

## 2023-04-11 ENCOUNTER — Telehealth: Payer: Self-pay

## 2023-04-11 ENCOUNTER — Other Ambulatory Visit (HOSPITAL_COMMUNITY): Payer: Self-pay

## 2023-04-11 NOTE — Telephone Encounter (Signed)
*  [facility]  Pharmacy Patient Advocate Encounter   Received notification from Fax that prior authorization for Zeposia 0.92MG  capsules  is required/requested.   Insurance verification completed.   The patient is insured through [facility] .   Per test claim: PA required; PA submitted to above mentioned insurance via CoverMyMeds Key/confirmation #/[id number] Status is pending

## 2023-04-12 ENCOUNTER — Other Ambulatory Visit (HOSPITAL_COMMUNITY): Payer: Self-pay

## 2023-04-12 ENCOUNTER — Other Ambulatory Visit: Payer: Self-pay | Admitting: Neurology

## 2023-04-12 NOTE — Telephone Encounter (Signed)
 Why was the last time refilled 2 years ago by [place] and not [facility]?  Says continue but that struck me as odd routing to [facility] to clarify

## 2023-04-12 NOTE — Telephone Encounter (Signed)
 Pharmacy Patient Advocate Encounter  Received notification from [facility] that Prior Authorization for Zeposia 0.92MG  capsules  has been APPROVED from 04/11/2023 to 04/10/2024. Unable to obtain price due to refill too soon rejection, last fill date n/a next available fill date02/12/2023

## 2023-06-12 ENCOUNTER — Other Ambulatory Visit: Payer: Self-pay | Admitting: Neurology

## 2023-06-13 NOTE — Telephone Encounter (Signed)
 Last seen on 12/17/22 Follow up scheduled on 07/01/23

## 2023-06-17 ENCOUNTER — Emergency Department

## 2023-06-17 ENCOUNTER — Emergency Department
Admission: EM | Admit: 2023-06-17 | Discharge: 2023-06-17 | Disposition: A | Discharge status: Home / Self Care | Attending: Emergency Medicine | Admitting: Emergency Medicine

## 2023-06-17 ENCOUNTER — Other Ambulatory Visit: Payer: Self-pay

## 2023-06-17 DIAGNOSIS — R079 Chest pain, unspecified: Secondary | ICD-10-CM | POA: Diagnosis present

## 2023-06-17 DIAGNOSIS — M940 Chondrocostal junction syndrome [Tietze]: Secondary | ICD-10-CM | POA: Insufficient documentation

## 2023-06-17 DIAGNOSIS — R0789 Other chest pain: Secondary | ICD-10-CM

## 2023-06-17 LAB — CBC WITH DIFFERENTIAL/PLATELET
Abs Immature Granulocytes: 0.01 10*3/uL (ref 0.00–0.07)
Basophils Absolute: 0 10*3/uL (ref 0.0–0.1)
Basophils Relative: 1 %
Eosinophils Absolute: 0.1 10*3/uL (ref 0.0–0.5)
Eosinophils Relative: 2 %
HCT: 42.5 % (ref 39.0–52.0)
Hemoglobin: 14 g/dL (ref 13.0–17.0)
Immature Granulocytes: 0 %
Lymphocytes Relative: 12 %
Lymphs Abs: 0.4 10*3/uL — ABNORMAL LOW (ref 0.7–4.0)
MCH: 32 pg (ref 26.0–34.0)
MCHC: 32.9 g/dL (ref 30.0–36.0)
MCV: 97.3 fL (ref 80.0–100.0)
Monocytes Absolute: 0.5 10*3/uL (ref 0.1–1.0)
Monocytes Relative: 15 %
Neutro Abs: 2.5 10*3/uL (ref 1.7–7.7)
Neutrophils Relative %: 70 %
Platelets: 173 10*3/uL (ref 150–400)
RBC: 4.37 MIL/uL (ref 4.22–5.81)
RDW: 12.3 % (ref 11.5–15.5)
WBC: 3.5 10*3/uL — ABNORMAL LOW (ref 4.0–10.5)
nRBC: 0 % (ref 0.0–0.2)

## 2023-06-17 LAB — BASIC METABOLIC PANEL WITH GFR
Anion gap: 9 (ref 5–15)
BUN: 10 mg/dL (ref 6–20)
CO2: 28 mmol/L (ref 22–32)
Calcium: 9.4 mg/dL (ref 8.9–10.3)
Chloride: 102 mmol/L (ref 98–111)
Creatinine, Ser: 1 mg/dL (ref 0.61–1.24)
GFR, Estimated: >60 mL/min (ref >60)
Glucose, Bld: 86 mg/dL (ref 70–99)
Potassium: 3.7 mmol/L (ref 3.5–5.1)
Sodium: 139 mmol/L (ref 135–145)

## 2023-06-17 LAB — TROPONIN I (HIGH SENSITIVITY): Troponin I (High Sensitivity): 3 ng/L (ref <18)

## 2023-06-17 MED ORDER — LIDOCAINE 5 % EX PTCH: 1.0000 | MEDICATED_PATCH | Freq: Two times a day (BID) | CUTANEOUS | 0 refills | Status: DC

## 2023-06-17 MED ORDER — KETOROLAC TROMETHAMINE 30 MG/ML IJ SOLN
30.0000 mg | Freq: Once | INTRAMUSCULAR | Status: AC
  Administered 2023-06-17: 30 mg via INTRAMUSCULAR
  Filled 2023-06-17: qty 1

## 2023-06-17 NOTE — ED Provider Notes (Signed)
 [facility] Provider Note    Event Date/Time   First MD Initiated Contact with Patient 06/17/23 1821     (approximate)   History   Chief Complaint Chest Pain   HPI  Juan Foster is a 43 y.o. male with past medical history of MS and pneumothorax who presents to the ED complaining of chest pain.  Patient reports that he has had about 1 month of gradually worsening pain in the center of his chest.  He describes it as a burning that is significantly worse when he goes to move his chest or take a deep breath.  He denies any associated fevers, cough, or difficulty breathing, has not had any pain or swelling in his legs.  He reports history of multiple pneumothoraces requiring chest tube placement, however had pleurodesis performed almost 20 years ago and no issues since then.  He has not been taking any medications prior to arrival.     Physical Exam   Triage Vital Signs: ED Triage Vitals  Encounter Vitals Group     BP 06/17/23 1522 (!) 158/96     Systolic BP Percentile --      Diastolic BP Percentile --      Pulse Rate 06/17/23 1522 61     Resp 06/17/23 1522 18     Temp 06/17/23 1522 98 F (36.7 C)     Temp Source 06/17/23 1522 Oral     SpO2 06/17/23 1522 100 %     Weight 06/17/23 1523 135 lb (61.2 kg)     Height 06/17/23 1523 6' (1.829 m)     Head Circumference --      Peak Flow --      Pain Score 06/17/23 1523 8     Pain Loc --      Pain Education --      Exclude from Growth Chart --     Most recent vital signs: Vitals:   06/17/23 1522  BP: (!) 158/96  Pulse: 61  Resp: 18  Temp: 98 F (36.7 C)  SpO2: 100%    Constitutional: Alert and oriented. Eyes: Conjunctivae are normal. Head: Atraumatic. Nose: No congestion/rhinnorhea. Mouth/Throat: Mucous membranes are moist.  Cardiovascular: Normal rate, regular rhythm. Grossly normal heart sounds.  2+ radial pulses bilaterally. Respiratory: Normal respiratory effort.  No retractions. Lungs  CTAB.  Central chest wall tenderness to palpation noted along the sternum. Gastrointestinal: Soft and nontender. No distention. Musculoskeletal: No lower extremity tenderness nor edema.  Neurologic:  Normal speech and language. No gross focal neurologic deficits are appreciated.    ED Results / Procedures / Treatments   Labs (all labs ordered are listed, but only abnormal results are displayed) Labs Reviewed  CBC WITH DIFFERENTIAL/PLATELET - Abnormal; Notable for the following components:      Result Value   WBC 3.5 (*)    Lymphs Abs 0.4 (*)    All other components within normal limits  BASIC METABOLIC PANEL WITH GFR  TROPONIN I (HIGH SENSITIVITY)     EKG  ED ECG REPORT I, Chesley Noon, the attending physician, personally viewed and interpreted this ECG.   Date: 06/17/2023  EKG Time: 15:25  Rate: 55  Rhythm: sinus bradycardia  Axis: RAD  Intervals:none  ST&T Change: None  RADIOLOGY Chest x-ray reviewed and interpreted by me with no infiltrate, edema, or effusion.  PROCEDURES:  Critical Care performed: No  Procedures   MEDICATIONS ORDERED IN ED: Medications  ketorolac (TORADOL) 30 MG/ML injection 30 mg (  30 mg Intramuscular Given 06/17/23 1856)     IMPRESSION / MDM / ASSESSMENT AND PLAN / ED COURSE  I reviewed the triage vital signs and the nursing notes.                              43 y.o. male with past medical history of pneumothorax status post pleurodesis and MS who presents to the ED complaining of 1 month of gradually worsening chest pain.  Patient's presentation is most consistent with acute presentation with potential threat to life or bodily function.  Differential diagnosis includes, but is not limited to, ACS, PE, pneumonia, pneumothorax, musculoskeletal pain, GERD, anxiety.  Patient nontoxic-appearing and in no acute distress, vital signs are unremarkable.  EKG shows no evidence of arrhythmia or ischemia and troponin within normal limits,  doubt ACS.  Symptoms are atypical and reproducible with palpation, suspect musculoskeletal etiology.  Chest x-ray is unremarkable and remainder of labs are reassuring with no significant anemia, leukocytosis, electrolyte abnormality, or AKI.  Patient is PERC negative and I doubt PE.  We will treat with Foster Toradol and patient appropriate for discharge home with outpatient PCP follow-up, was counseled to return to the ED for new or worsening symptoms.  Patient agrees with plan.      FINAL CLINICAL IMPRESSION(S) / ED DIAGNOSES   Final diagnoses:  Atypical chest pain  Costochondritis     Rx / DC Orders   ED Discharge Orders          Ordered    lidocaine (LIDODERM) 5 %  Every 12 hours        06/17/23 1853             Note:  This document was prepared using Dragon voice recognition software and may include unintentional dictation errors.   Twilla Galea, MD 06/17/23 [phone]

## 2023-06-17 NOTE — ED Triage Notes (Signed)
 Pt presents to ED with mid sternal CP that worsens on palpation. NAD noted at this time. Pt denies radiation. NAD noted. Pt states HX of pneumothorax.

## 2023-06-17 NOTE — ED Provider Triage Note (Signed)
 Emergency Medicine Provider Triage Evaluation Note  Juan Foster , a 43 y.o. male  was evaluated in triage.  Pt complains of chest pain in the middle of the chest. It is worse with a deep breath, reproducible and worse with movement.   Patient does have a history of pneumothorax and did need to have surgery to fix this, states the surgery was to glue his lungs to the chest wall.  Review of Systems  Positive: SOB, CP Negative:   Physical Exam  There were no vitals taken for this visit. Gen:   Awake, no distress   Resp:  Normal effort  MSK:   Moves extremities without difficulty  Other:    Medical Decision Making  Medically screening exam initiated at 3:20 PM.  Appropriate orders placed.  Juan Foster was informed that the remainder of the evaluation will be completed by another provider, this initial triage assessment does not replace that evaluation, and the importance of remaining in the ED until their evaluation is complete.     Phyliss Breen, PA-C 06/17/23 1523

## 2023-07-01 ENCOUNTER — Encounter: Payer: Self-pay | Admitting: Neurology

## 2023-07-01 ENCOUNTER — Ambulatory Visit (INDEPENDENT_AMBULATORY_CARE_PROVIDER_SITE_OTHER): Payer: 59 | Admitting: Neurology

## 2023-07-01 VITALS — BP 104/68 | HR 56 | Ht 72.0 in | Wt 133.0 lb

## 2023-07-01 DIAGNOSIS — Z79899 Other long term (current) drug therapy: Secondary | ICD-10-CM

## 2023-07-01 DIAGNOSIS — M545 Low back pain, unspecified: Secondary | ICD-10-CM | POA: Diagnosis not present

## 2023-07-01 DIAGNOSIS — E559 Vitamin D deficiency, unspecified: Secondary | ICD-10-CM | POA: Diagnosis not present

## 2023-07-01 DIAGNOSIS — G8929 Other chronic pain: Secondary | ICD-10-CM

## 2023-07-01 DIAGNOSIS — G35 Multiple sclerosis: Secondary | ICD-10-CM | POA: Diagnosis not present

## 2023-07-01 MED ORDER — METHYLPREDNISOLONE 4 MG PO TABS: ORAL_TABLET | ORAL | 0 refills | Status: AC

## 2023-07-01 NOTE — Progress Notes (Signed)
 [facility]  PATIENT: Juan Foster DOB: 1981-02-21  REFERRING DOCTOR OR PCP:     Richrd Char, MD (PCP); referred by Debbra Fairy, MD PhD,  neurology SOURCE: Patient, notes from Dr. Omar Bibber, imaging and laboratory reports, MRI images personally reviewed.  _________________________________   HISTORICAL  CHIEF COMPLAINT:  Chief Complaint  Patient presents with   Follow-up    Pt in 10 alone Pt here for MS f/u Pt states no questions or concerns for today's visit    HISTORY OF PRESENT ILLNESS:  Juan Foster is a 43 y.o. man with relapsing remitting multiple sclerosis.   Update 07/01/2023 He is generally doing well clinically on Zeposia with no exacerbation or new neurologic symptom.   THe 2024 showed no new lesion.    2022  MRI showed 2 small new lesions compared to 2021,one enhanceing.  The 2023 MRi showed one very small new lesion (nonenhancing).  We had a discussion about the MRI.  We discussed that if the next MRI also shows that there has been changes, even if minimal, that I would recommend we consider a different disease modifying therapy.     He denies any new neurologic symptoms.   He can run and walk unlimited.    He notes the right hand will shake if he is doing a task requiring more task.    He has old right arm numbness.  He feels his right leg is mildly weak and fatigue easy.     Bladder function is fine.   Vision is fine.  He has intermittent lower back pain.  Pain is axial only.  Activity can increase it some.   MRI in the past had shown disc bulge at L5-S1.    He had costochondritis and went to the ED.   He was given a steroid and told to take NSAIDs.  This has helped some.    He notes mild fatigue and sleeps 5-6 hours most nights, feeling refreshed in the morning.  Sometimes he will have mild insomnia.  He denies mood or cognitive issues.   He has a 18 year old and 3 kids under age two in the house (does foster kids)    He hopes to adopt the  younger kids  MS history: He had the onset of a dysesthetic pain in his right leg in November 2020.  Although symptoms change some they persisted and he had an MRI in January 2021 that just showed minimal DJD.  He also had arm numbness. He was referred to Dr. Omar Bibber who ordered MRI of the cervical spine which showed a subtle focus at [facility].    MRI of the brain showed multiple T2/FLAIR hyperintense foci in the hemispheres, some radially oriented to the ventricles.  2 foci enhance.  He entered at open labeled drug study for Zeposia in May 2021.  MRI in 2022 showed 2 small new lesions not present in 2021.  MRI in 2023 showed 1 small new lesion not present in 2022.  There were no new neurologic symptoms associated symptoms.   MRI in 2023 and 2024 showed no further progression.    Data personally reviewed: MRI lumbar spine 04/03/2019: There is minimal disc bulging at L5-S1.  The study is otherwise normal and there is no nerve root compression or spinal stenosis.  MRI of the cervical spine 04/28/2019 shows T2 hyperintense signal posterolaterally to the right adjacent to C4.  MRI of the brain 06/02/2019 shows multiple T2/FLAIR hyperintense foci in the  periventricular, juxtacortical and deep white matter.  2 of the foci enhance after contrast.  No lesions are noted in the posterior fossa.  MRI of the brain 08/02/2020 showed 2 small new lesions not present in 2021 in the hemispheres.  One of the foci enhanced.  MRI of the head 08/03/2021 showed a single new focus in the left periatrial white matter not present in 2022.  It did not enhance.  MRI 08/04/2022 showed no new lesions  REVIEW OF SYSTEMS: Constitutional: No fevers, chills, sweats, or change in appetite Eyes: No visual changes, double vision, eye pain Ear, nose and throat: No hearing loss, ear pain, nasal congestion, sore throat Cardiovascular: No chest pain, palpitations Respiratory:  No shortness of breath at rest or with exertion.   No  wheezes GastrointestinaI: No nausea, vomiting, diarrhea, abdominal pain, fecal incontinence Genitourinary:  No dysuria, urinary retention or frequency.  No nocturia. Musculoskeletal:  No neck pain, back pain Integumentary: No rash, pruritus, skin lesions Neurological: as above Psychiatric: No depression at this time.  No anxiety Endocrine: No palpitations, diaphoresis, change in appetite, change in weigh or increased thirst Hematologic/Lymphatic:  No anemia, purpura, petechiae. Allergic/Immunologic: No itchy/runny eyes, nasal congestion, recent allergic reactions, rashes  ALLERGIES: No Known Allergies  HOME MEDICATIONS:  Current Outpatient Medications:    cyclobenzaprine  (FLEXERIL ) 5 MG tablet, TAKE 1 TABLET BY MOUTH AT BEDTIME, Disp: 30 tablet, Rfl: 0   gabapentin  (NEURONTIN ) 300 MG capsule, Take 1 capsule (300 mg total) by mouth daily as needed. (Patient taking differently: Take 300 mg by mouth as needed.), Disp: , Rfl:    methylPREDNISolone (MEDROL) 4 MG tablet, Taper from 6 pills po for one day to 1 pill po the last day over 6 days, Disp: 21 tablet, Rfl: 0   ZEPOSIA 0.92 MG CAPS, TAKE 1 CAPSULE BY MOUTH DAILY, Disp: 30 capsule, Rfl: 11   valACYclovir  (VALTREX ) 1000 MG tablet, Take 1 tablet (1,000 mg total) by mouth 3 (three) times daily. (Patient not taking: Reported on 07/01/2023), Disp: 21 tablet, Rfl: 0  PAST MEDICAL HISTORY: Past Medical History:  Diagnosis Date   History of chicken pox    Lung collapse    x2 on L and x3 on R, s/p repair by CVTS 2006   MS (multiple sclerosis) (HCC)     PAST SURGICAL HISTORY: Past Surgical History:  Procedure Laterality Date   Surgery to fuse lungs to cavity wall to prevent collapse  2006    FAMILY HISTORY: Family History  Problem Relation Age of Onset   Diverticulitis Father    Melanoma Father    COPD Paternal Grandfather    Sudden death Paternal Grandfather    Prostate cancer Neg Hx    Colon cancer Neg Hx    Multiple sclerosis  Neg Hx     SOCIAL HISTORY:  Social History   Socioeconomic History   Marital status: Married    Spouse name: Not on file   Number of children: Not on file   Years of education: Not on file   Highest education level: Bachelor's degree (e.g., BA, AB, BS)  Occupational History   Occupation: Adult nurse: [employer]  Tobacco Use   Smoking status: Former    Current packs/day: 0.00    Types: Cigarettes    Quit date: 02/13/2011    Years since quitting: 12.3   Smokeless tobacco: Never  Vaping Use   Vaping status: Not on file  Substance and Sexual Activity   Alcohol use: Yes  Alcohol/week: 0.0 standard drinks of alcohol    Comment: very rare wine   Drug use: No   Sexual activity: Not on file  Other Topics Concern   Not on file  Social History Narrative   Exercises at least 3 times a week.   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    Has guardianship for Kaydan.     Social Drivers of Corporate investment banker Strain: Not on file  Food Insecurity: Not on file  Transportation Needs: Not on file  Physical Activity: Not on file  Stress: Not on file  Social Connections: Unknown (07/04/2021)   [facility]   Social Network    Social Network: Not on file  Intimate Partner Violence: Unknown (06/05/2021)   Received from [facility], [facility]   HITS    Physically Hurt: Not on file    Insult or Talk Down To: Not on file    Threaten Physical Harm: Not on file    Scream or Curse: Not on file     PHYSICAL EXAM  Vitals:   07/01/23 0825  BP: 104/68  Pulse: (!) 56  Weight: 133 lb (60.3 kg)  Height: 6' (1.829 m)    Body mass index is 18.04 kg/m.  No results  found.    General: The patient is well-developed and well-nourished and in no acute distress  HEENT:  Head is /AT.  Sclera are anicteric.     Skin: Extremities are without rash or  edema.  Musculoskeletal:  Back is nontender  Neurologic Exam  Mental status: The patient is alert and oriented x 3 at the time of the examination. The patient has apparent normal recent and remote memory, with an apparently normal attention span and concentration ability.   Speech is normal.  Cranial nerves: Extraocular movements are full.Facial strnegth and sensation are normal.  Trapezius and sternocleidomastoid strength is normal. No dysarthria is noted.    No obvious hearing deficits are noted.  Motor:  Muscle bulk is normal.   Tone is normal. Strength is  5 / 5 in all 4 extremities.   Sensory: Sensory testing is intact to pinprick, soft touch and vibration sensation elswhere in limbs but reduced touch from axilla/adjacent upper arm to +/- T6.  Coordination: Cerebellar testing reveals good finger-nose-finger and heel-to-shin bilaterally.  Gait and station: Station is normal.  He has a normal gait and normal tandem gait.  Romberg is negative.   Reflexes: Deep tendon reflexes are symmetric and normal bilaterally.       DIAGNOSTIC DATA (LABS, IMAGING, TESTING) - I reviewed patient records, labs, notes, testing and imaging myself where available.  Lab Results  Component Value Date   WBC 3.5 (L) 06/17/2023   HGB 14.0 06/17/2023   HCT 42.5 06/17/2023   MCV 97.3 06/17/2023   PLT 173 06/17/2023      Component Value Date/Time   NA 139 06/17/2023 1525   NA 135 04/07/2019 0921   NA 138 06/20/2014 1855   K 3.7 06/17/2023 1525   K 4.3 06/20/2014 1855   CL 102 06/17/2023 1525   CL 101 06/20/2014 1855   CO2 28 06/17/2023 1525  CO2 28 06/20/2014 1855   GLUCOSE 86 06/17/2023 1525   GLUCOSE 84 06/20/2014 1855   BUN 10 06/17/2023 1525   BUN 9 04/07/2019 0921   BUN 8 06/20/2014 1855   CREATININE  1.00 06/17/2023 1525   CREATININE 0.92 06/20/2014 1855   CALCIUM 9.4 06/17/2023 1525   CALCIUM 9.4 06/20/2014 1855   PROT 6.7 10/22/2022 0807   PROT 7.1 04/07/2019 0921   PROT 8.1 06/20/2014 1855   ALBUMIN 4.3 10/22/2022 0807   ALBUMIN 5.0 04/07/2019 0921   ALBUMIN 5.2 (H) 06/20/2014 1855   AST 35 10/22/2022 0807   AST 29 06/20/2014 1855   ALT 50 10/22/2022 0807   ALT 26 06/20/2014 1855   ALKPHOS 37 (L) 10/22/2022 0807   ALKPHOS 42 06/20/2014 1855   BILITOT 1.1 10/22/2022 0807   BILITOT 0.9 04/07/2019 0921   BILITOT 0.5 06/20/2014 1855   GFRNONAA >60 06/17/2023 1525   GFRNONAA >60 06/20/2014 1855   GFRAA 131 04/07/2019 0921   GFRAA >60 06/20/2014 1855   Lab Results  Component Value Date   CHOL 198 10/22/2022   HDL 59.50 10/22/2022   LDLCALC 126 (H) 10/22/2022   TRIG 62.0 10/22/2022   CHOLHDL 3 10/22/2022   Lab Results  Component Value Date   HGBA1C 5.2 04/07/2019   Lab Results  Component Value Date   VITAMINB12 282 04/07/2019   Lab Results  Component Value Date   TSH 0.510 04/07/2019       ASSESSMENT AND PLAN  Multiple sclerosis (HCC)  Vitamin D  deficiency  Chronic bilateral low back pain without sciatica  Continue Zeposia.  Labs were ok recently (August 2024 lymphocyte count is 0.3).   MRI brain.  As a prior MRi (2022) showed mild new activity would need to consider a switch if additional  new lesions Flexeril  5 mg p.o. prn nightly for LBP Rtc 6 months or sooner if new or worsening symptoms.  Carletha Dawn A. Godwin Lat, MD, [facility] 07/01/2023, 8:55 AM Certified in Neurology, Clinical Neurophysiology, Sleep Medicine and Neuroimaging  [facility] [address], [address] [place], [place] [address] [phone])

## 2023-07-02 ENCOUNTER — Encounter: Payer: Self-pay | Admitting: Neurology

## 2023-07-02 LAB — CBC WITH DIFFERENTIAL/PLATELET
Basophils Absolute: 0 10*3/uL (ref 0.0–0.2)
Basos: 1 %
EOS (ABSOLUTE): 0.1 10*3/uL (ref 0.0–0.4)
Eos: 3 %
Hematocrit: 42.9 % (ref 37.5–51.0)
Hemoglobin: 14 g/dL (ref 13.0–17.7)
Immature Grans (Abs): 0 10*3/uL (ref 0.0–0.1)
Immature Granulocytes: 1 %
Lymphocytes Absolute: 0.3 10*3/uL — ABNORMAL LOW (ref 0.7–3.1)
Lymphs: 15 %
MCH: 31.7 pg (ref 26.6–33.0)
MCHC: 32.6 g/dL (ref 31.5–35.7)
MCV: 97 fL (ref 79–97)
Monocytes Absolute: 0.4 10*3/uL (ref 0.1–0.9)
Monocytes: 17 %
Neutrophils Absolute: 1.4 10*3/uL (ref 1.4–7.0)
Neutrophils: 63 %
Platelets: 150 10*3/uL (ref 150–450)
RBC: 4.42 x10E6/uL (ref 4.14–5.80)
RDW: 12.1 % (ref 11.6–15.4)
WBC: 2.2 10*3/uL — CL (ref 3.4–10.8)

## 2023-07-02 LAB — VITAMIN D 25 HYDROXY (VIT D DEFICIENCY, FRACTURES): Vit D, 25-Hydroxy: 20.6 ng/mL — ABNORMAL LOW (ref 30.0–100.0)

## 2023-07-23 ENCOUNTER — Ambulatory Visit

## 2023-07-23 ENCOUNTER — Ambulatory Visit: Payer: Self-pay | Admitting: Neurology

## 2023-07-23 DIAGNOSIS — G35 Multiple sclerosis: Secondary | ICD-10-CM

## 2023-07-23 DIAGNOSIS — Z79899 Other long term (current) drug therapy: Secondary | ICD-10-CM

## 2023-07-23 MED ORDER — GADOBENATE DIMEGLUMINE 529 MG/ML IV SOLN
12.0000 mL | Freq: Once | INTRAVENOUS | Status: AC | PRN
Start: 2023-07-23 — End: 2023-07-23
  Administered 2023-07-23: 12 mL via INTRAVENOUS

## 2024-02-10 ENCOUNTER — Encounter: Payer: Self-pay | Admitting: Neurology

## 2024-02-10 ENCOUNTER — Ambulatory Visit: Admitting: Neurology

## 2024-02-10 VITALS — BP 104/66 | HR 59 | Resp 15 | Ht 72.0 in | Wt 137.0 lb

## 2024-02-10 DIAGNOSIS — G8929 Other chronic pain: Secondary | ICD-10-CM

## 2024-02-10 DIAGNOSIS — G35A Relapsing-remitting multiple sclerosis: Secondary | ICD-10-CM

## 2024-02-10 DIAGNOSIS — Z79899 Other long term (current) drug therapy: Secondary | ICD-10-CM | POA: Diagnosis not present

## 2024-02-10 DIAGNOSIS — M545 Low back pain, unspecified: Secondary | ICD-10-CM

## 2024-02-10 DIAGNOSIS — E559 Vitamin D deficiency, unspecified: Secondary | ICD-10-CM

## 2024-02-10 NOTE — Progress Notes (Signed)
 [facility]  PATIENT: Juan Foster DOB: [birth year]  REFERRING DOCTOR OR PCP:     Arlyss Solian, MD (PCP); referred by True Mar, MD PhD,  neurology SOURCE: Patient, notes from Dr. Mar, imaging and laboratory reports, MRI images personally reviewed.  _________________________________   HISTORICAL  CHIEF COMPLAINT:  Chief Complaint  Patient presents with   Multiple Sclerosis    RM11, ALONE, MS FOLLOW UP     HISTORY OF PRESENT ILLNESS:  Juan Foster is a 43 y.o. man with relapsing remitting multiple sclerosis.   Update 02/10/2024 He is generally doing well clinically on Zeposia with no exacerbation or new neurologic symptom. No issues with coverage.    THe 2024 showed no new lesion.    2022  MRI showed 2 small new lesions compared to 2021,one enhanceing.  The 2023 MRi showed one very small new lesion (nonenhancing).  We had a discussion about the MRI.  We discussed that if the next MRI also shows that there has been changes, even if minimal, that I would recommend we consider a different disease modifying therapy.     He denies any new neurologic symptoms.   Denies issues with gait or balance.   Stairs are not  problem and does not need the bannister.   He can run and walk unlimited.   He denies any issues with the right arm or any dysesthesia.    Riht leg no longer gets fatigue     Bladder function is fine.   Vision is fine.  He has occasional ower back pain.  Worse with some activity.  Pain is axial only.  Activity can increase it some.   MRI in the past had shown disc bulge at L5-S1.   No further episode of costochondritis.     He notes mild fatigue and sleeps 5-6 hours most nights, feeling refreshed in the morning.  Sometimes he will have mild insomnia.  He denies mood or cognitive issues.   He has a 73 year old and 3 kids under age three in the house (does foster kids)    He hopes to adopt the younger kids. They have variable sleep which sometimes wears  him out.    MS history: He had the onset of a dysesthetic pain in his right leg in November 2020.  Although symptoms change some they persisted and he had an MRI in January 2021 that just showed minimal DJD.  He also had arm numbness. He was referred to Dr. Mar who ordered MRI of the cervical spine which showed a subtle focus at [facility].    MRI of the brain showed multiple T2/FLAIR hyperintense foci in the hemispheres, some radially oriented to the ventricles.  2 foci enhance.  He entered at open labeled drug study for Zeposia in May 2021.  MRI in 2022 showed 2 small new lesions not present in 2021.  MRI in 2023 showed 1 small new lesion not present in 2022.  There were no new neurologic symptoms associated symptoms.   MRI in 2023 and 2024 showed no further progression.    Data personally reviewed: MRI lumbar spine 04/03/2019: There is minimal disc bulging at L5-S1.  The study is otherwise normal and there is no nerve root compression or spinal stenosis.  MRI of the cervical spine 04/28/2019 shows T2 hyperintense signal posterolaterally to the right adjacent to C4.  MRI of the brain 06/02/2019 shows multiple T2/FLAIR hyperintense foci in the periventricular, juxtacortical and deep white matter.  2 of the foci enhance after contrast.  No lesions are noted in the posterior fossa.  MRI of the brain 08/02/2020 showed 2 small new lesions not present in 2021 in the hemispheres.  One of the foci enhanced.  MRI of the head 08/03/2021 showed a single new focus in the left periatrial white matter not present in 2022.  It did not enhance.  MRI 08/04/2022 showed no new lesions  MRI 5/20/20245 showed Multiple T2/FLAIR hyperintense foci in the cerebral hemispheres in a pattern consistent with chronic demyelinating plaque associated with multiple sclerosis. None of these foci enhancement appear to be acute. Compared to the MRI from 08/03/2021, there were no new lesions.    REVIEW OF SYSTEMS: Constitutional: No fevers,  chills, sweats, or change in appetite Eyes: No visual changes, double vision, eye pain Ear, nose and throat: No hearing loss, ear pain, nasal congestion, sore throat Cardiovascular: No chest pain, palpitations Respiratory:  No shortness of breath at rest or with exertion.   No wheezes GastrointestinaI: No nausea, vomiting, diarrhea, abdominal pain, fecal incontinence Genitourinary:  No dysuria, urinary retention or frequency.  No nocturia. Musculoskeletal:  No neck pain, back pain Integumentary: No rash, pruritus, skin lesions Neurological: as above Psychiatric: No depression at this time.  No anxiety Endocrine: No palpitations, diaphoresis, change in appetite, change in weigh or increased thirst Hematologic/Lymphatic:  No anemia, purpura, petechiae. Allergic/Immunologic: No itchy/runny eyes, nasal congestion, recent allergic reactions, rashes  ALLERGIES: No Known Allergies  HOME MEDICATIONS:  Current Outpatient Medications:    cyclobenzaprine  (FLEXERIL ) 5 MG tablet, TAKE 1 TABLET BY MOUTH AT BEDTIME, Disp: 30 tablet, Rfl: 0   gabapentin  (NEURONTIN ) 300 MG capsule, Take 1 capsule (300 mg total) by mouth daily as needed. (Patient taking differently: Take 300 mg by mouth as needed.), Disp: , Rfl:    methylPREDNISolone  (MEDROL ) 4 MG tablet, Taper from 6 pills po for one day to 1 pill po the last day over 6 days, Disp: 21 tablet, Rfl: 0   valACYclovir  (VALTREX ) 1000 MG tablet, Take 1 tablet (1,000 mg total) by mouth 3 (three) times daily., Disp: 21 tablet, Rfl: 0   ZEPOSIA 0.92 MG CAPS, TAKE 1 CAPSULE BY MOUTH DAILY, Disp: 30 capsule, Rfl: 11  PAST MEDICAL HISTORY: Past Medical History:  Diagnosis Date   History of chicken pox    Lung collapse    x2 on L and x3 on R, s/p repair by CVTS 2006   MS (multiple sclerosis)     PAST SURGICAL HISTORY: Past Surgical History:  Procedure Laterality Date   Surgery to fuse lungs to cavity wall to prevent collapse  2006    FAMILY  HISTORY: Family History  Problem Relation Age of Onset   Diverticulitis Father    Melanoma Father    COPD Paternal Grandfather    Sudden death Paternal Grandfather    Prostate cancer Neg Hx    Colon cancer Neg Hx    Multiple sclerosis Neg Hx     SOCIAL HISTORY:  Social History   Socioeconomic History   Marital status: Married    Spouse name: Not on file   Number of children: Not on file   Years of education: Not on file   Highest education level: Bachelor's degree (e.g., BA, AB, BS)  Occupational History   Occupation: Adult Nurse: [employer]  Tobacco Use   Smoking status: Former    Current packs/day: 0.00    Types: Cigarettes    Quit date: 02/13/2011  Years since quitting: 13.0   Smokeless tobacco: Never  Vaping Use   Vaping status: Not on file  Substance and Sexual Activity   Alcohol use: Yes    Alcohol/week: 0.0 standard drinks of alcohol    Comment: very rare wine   Drug use: No   Sexual activity: Not on file  Other Topics Concern   Not on file  Social History Narrative   Exercises at least 3 times a week.   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    Has guardianship for Kaydan.     Social Drivers of Corporate Investment Banker Strain: Not on file  Food Insecurity: Not on file  Transportation Needs: Not on file  Physical Activity: Not on file  Stress: Not on file  Social Connections: Not on file  Intimate Partner Violence: Not on file     PHYSICAL EXAM  Vitals:   02/10/24 0820  BP: 104/66  Pulse: (!) 59  Resp: 15  SpO2: 97%  Weight: 137 lb (62.1 kg)  Height: 6' (1.829 m)    Body mass index is 18.58 kg/m.  No results found.    General: The patient is well-developed and  well-nourished and in no acute distress  HEENT:  Head is [place]/AT.  Sclera are anicteric.     Skin: Extremities are without rash or  edema.  Musculoskeletal:  Back is nontender  Neurologic Exam  Mental status: The patient is alert and oriented x 3 at the time of the examination. The patient has apparent normal recent and remote memory, with an apparently normal attention span and concentration ability.   Speech is normal.  Cranial nerves: Extraocular movements are full.Facial strnegth and sensation are normal.  Trapezius and sternocleidomastoid strength is normal. No dysarthria is noted.    No obvious hearing deficits are noted.  Motor:  Muscle bulk is normal.   Tone is normal. Strength is  5 / 5 in all 4 extremities.   Sensory: Sensory testing is intact to pinprick, soft touch and vibration sensation elswhere in limbs but reduced touch from axilla/adjacent upper arm to +/- T6.  Coordination: Cerebellar testing reveals good finger-nose-finger and heel-to-shin bilaterally.  Gait and station: Station is normal.  Gait and tandem gait are normal. .  Romberg is negative.   Reflexes: Deep tendon reflexes are symmetric and normal bilaterally.       DIAGNOSTIC DATA (LABS, IMAGING, TESTING) - I reviewed patient records, labs, notes, testing and imaging myself where available.  Lab Results  Component Value Date   WBC 2.2 (LL) 07/01/2023   HGB 14.0 07/01/2023   HCT 42.9 07/01/2023   MCV 97 07/01/2023   PLT 150 07/01/2023      Component Value Date/Time   NA 139 06/17/2023 1525   NA 135 04/07/2019 0921   NA 138 06/20/2014 1855   K 3.7 06/17/2023 1525   K 4.3 06/20/2014 1855   CL 102 06/17/2023 1525   CL 101 06/20/2014 1855   CO2 28 06/17/2023 1525   CO2 28 06/20/2014 1855   GLUCOSE 86 06/17/2023 1525   GLUCOSE 84 06/20/2014 1855   BUN 10 06/17/2023 1525   BUN 9 04/07/2019 0921  BUN 8 06/20/2014 1855   CREATININE 1.00 06/17/2023 1525   CREATININE 0.92 06/20/2014 1855   CALCIUM  9.4 06/17/2023 1525   CALCIUM 9.4 06/20/2014 1855   PROT 6.7 10/22/2022 0807   PROT 7.1 04/07/2019 0921   PROT 8.1 06/20/2014 1855   ALBUMIN 4.3 10/22/2022 0807   ALBUMIN 5.0 04/07/2019 0921   ALBUMIN 5.2 (H) 06/20/2014 1855   AST 35 10/22/2022 0807   AST 29 06/20/2014 1855   ALT 50 10/22/2022 0807   ALT 26 06/20/2014 1855   ALKPHOS 37 (L) 10/22/2022 0807   ALKPHOS 42 06/20/2014 1855   BILITOT 1.1 10/22/2022 0807   BILITOT 0.9 04/07/2019 0921   BILITOT 0.5 06/20/2014 1855   GFRNONAA >60 06/17/2023 1525   GFRNONAA >60 06/20/2014 1855   GFRAA 131 04/07/2019 0921   GFRAA >60 06/20/2014 1855   Lab Results  Component Value Date   CHOL 198 10/22/2022   HDL 59.50 10/22/2022   LDLCALC 126 (H) 10/22/2022   TRIG 62.0 10/22/2022   CHOLHDL 3 10/22/2022   Lab Results  Component Value Date   HGBA1C 5.2 04/07/2019   Lab Results  Component Value Date   VITAMINB12 282 04/07/2019   Lab Results  Component Value Date   TSH 0.510 04/07/2019       ASSESSMENT AND PLAN   Multiple sclerosis, relapsing-remitting - Plan: CBC with Differential/Platelet  Vitamin D  deficiency  Chronic bilateral low back pain without sciatica  High risk medication use  Continue Zeposia.  Has has low WBC, low lymphocytes (expected) and borderline low neutrophils (not expected) Stay active and exercise Vit D 5000 U daily supp recommended.   Rtc 6 months or sooner if new or worsening symptoms.  Clothilde Tippetts A. Vear, MD, [facility] 02/10/2024, 8:53 AM Certified in Neurology, Clinical Neurophysiology, Sleep Medicine and Neuroimaging  [facility] [address] [place], [place] [address] [phone])

## 2024-02-11 ENCOUNTER — Ambulatory Visit: Payer: Self-pay | Admitting: Neurology

## 2024-02-11 LAB — CBC WITH DIFFERENTIAL/PLATELET
Basophils Absolute: 0 x10E3/uL (ref 0.0–0.2)
Basos: 0 %
EOS (ABSOLUTE): 0.1 x10E3/uL (ref 0.0–0.4)
Eos: 2 %
Hematocrit: 39.6 % (ref 37.5–51.0)
Hemoglobin: 13.4 g/dL (ref 13.0–17.7)
Immature Grans (Abs): 0 x10E3/uL (ref 0.0–0.1)
Immature Granulocytes: 0 %
Lymphocytes Absolute: 0.2 x10E3/uL — ABNORMAL LOW (ref 0.7–3.1)
Lymphs: 7 %
MCH: 34 pg — ABNORMAL HIGH (ref 26.6–33.0)
MCHC: 33.8 g/dL (ref 31.5–35.7)
MCV: 101 fL — ABNORMAL HIGH (ref 79–97)
Monocytes Absolute: 0.4 x10E3/uL (ref 0.1–0.9)
Monocytes: 12 %
Neutrophils Absolute: 2.6 x10E3/uL (ref 1.4–7.0)
Neutrophils: 79 %
Platelets: 194 x10E3/uL (ref 150–450)
RBC: 3.94 x10E6/uL — ABNORMAL LOW (ref 4.14–5.80)
RDW: 13 % (ref 11.6–15.4)
WBC: 3.4 x10E3/uL (ref 3.4–10.8)

## 2024-03-18 ENCOUNTER — Telehealth: Payer: Self-pay | Admitting: Neurology

## 2024-03-18 NOTE — Telephone Encounter (Signed)
 Aleck from Zeposia called to  informed that is it for Pt medication  ZEPOSIA 0.92 MG CAPS  PA to be renewed   CoverMyMeds KEY # [id number]

## 2024-03-25 ENCOUNTER — Other Ambulatory Visit (HOSPITAL_COMMUNITY): Payer: Self-pay

## 2024-03-25 ENCOUNTER — Telehealth: Payer: Self-pay

## 2024-03-25 NOTE — Telephone Encounter (Signed)
 Pharmacy Patient Advocate Encounter   Received notification from Pt Calls Messages that prior authorization for Zeposia 0.92MG  capsules is required/requested.   Insurance verification completed.   The patient is insured through [facility].   Per test claim: PA required; PA submitted to above mentioned insurance via Latent Key/confirmation #/EOC [id number] Status is pending

## 2024-03-27 ENCOUNTER — Other Ambulatory Visit (HOSPITAL_COMMUNITY): Payer: Self-pay

## 2024-03-27 NOTE — Telephone Encounter (Signed)
 Pharmacy Patient Advocate Encounter  Received notification from [facility] that Prior Authorization for Zeposia 0.92MG  capsules has been APPROVED from 03-25-2024 to 03-25-2025   PA #/Case ID/Reference #: [id number]

## 2024-09-21 ENCOUNTER — Ambulatory Visit: Admitting: Neurology
